# Patient Record
Sex: Male | Born: 1965
Health system: Southern US, Community
[De-identification: ages and names within clinical notes are randomized; demographics above are authoritative.]

## PROBLEM LIST (undated history)

## (undated) DIAGNOSIS — Z9889 Other specified postprocedural states: Secondary | ICD-10-CM

## (undated) DIAGNOSIS — J45909 Unspecified asthma, uncomplicated: Secondary | ICD-10-CM

## (undated) DIAGNOSIS — G5620 Lesion of ulnar nerve, unspecified upper limb: Secondary | ICD-10-CM

## (undated) DIAGNOSIS — K649 Unspecified hemorrhoids: Secondary | ICD-10-CM

## (undated) DIAGNOSIS — K589 Irritable bowel syndrome without diarrhea: Secondary | ICD-10-CM

## (undated) DIAGNOSIS — Z8719 Personal history of other diseases of the digestive system: Secondary | ICD-10-CM

## (undated) DIAGNOSIS — K5790 Diverticulosis of intestine, part unspecified, without perforation or abscess without bleeding: Secondary | ICD-10-CM

## (undated) HISTORY — DX: Other specified postprocedural states: Z98.890

## (undated) HISTORY — DX: Diverticulosis of intestine, part unspecified, without perforation or abscess without bleeding: K57.90

## (undated) HISTORY — DX: Lesion of ulnar nerve, unspecified upper limb: G56.20

## (undated) HISTORY — PX: HERNIA REPAIR: SHX51

## (undated) HISTORY — DX: Irritable bowel syndrome, unspecified: K58.9

## (undated) HISTORY — DX: Unspecified asthma, uncomplicated: J45.909

## (undated) HISTORY — DX: Personal history of other diseases of the digestive system: Z87.19

## (undated) HISTORY — DX: Unspecified hemorrhoids: K64.9

## (undated) HISTORY — PX: COLONOSCOPY: SHX174

---

## 2000-05-20 ENCOUNTER — Encounter: Payer: Self-pay | Admitting: Family Medicine

## 2000-05-20 ENCOUNTER — Ambulatory Visit (HOSPITAL_COMMUNITY): Admission: RE | Admit: 2000-05-20 | Discharge: 2000-05-20 | Payer: Self-pay | Admitting: Family Medicine

## 2001-08-11 ENCOUNTER — Encounter: Payer: Self-pay | Admitting: Family Medicine

## 2001-08-11 ENCOUNTER — Ambulatory Visit (HOSPITAL_COMMUNITY): Admission: RE | Admit: 2001-08-11 | Discharge: 2001-08-11 | Payer: Self-pay | Admitting: Family Medicine

## 2001-09-14 ENCOUNTER — Encounter: Payer: Self-pay | Admitting: Otolaryngology

## 2001-09-14 ENCOUNTER — Ambulatory Visit (HOSPITAL_COMMUNITY): Admission: RE | Admit: 2001-09-14 | Discharge: 2001-09-14 | Payer: Self-pay | Admitting: Otolaryngology

## 2001-09-26 ENCOUNTER — Encounter: Payer: Self-pay | Admitting: Otolaryngology

## 2001-09-26 ENCOUNTER — Encounter (INDEPENDENT_AMBULATORY_CARE_PROVIDER_SITE_OTHER): Payer: Self-pay | Admitting: *Deleted

## 2001-09-26 ENCOUNTER — Ambulatory Visit (HOSPITAL_COMMUNITY): Admission: RE | Admit: 2001-09-26 | Discharge: 2001-09-26 | Payer: Self-pay | Admitting: Otolaryngology

## 2003-03-21 ENCOUNTER — Encounter: Admission: RE | Admit: 2003-03-21 | Discharge: 2003-03-21 | Payer: Self-pay | Admitting: Gastroenterology

## 2003-09-20 ENCOUNTER — Encounter: Admission: RE | Admit: 2003-09-20 | Discharge: 2003-09-20 | Payer: Self-pay | Admitting: Gastroenterology

## 2005-06-03 ENCOUNTER — Ambulatory Visit (HOSPITAL_COMMUNITY): Admission: RE | Admit: 2005-06-03 | Discharge: 2005-06-03 | Payer: Self-pay | Admitting: Cardiology

## 2005-06-03 ENCOUNTER — Ambulatory Visit: Payer: Self-pay | Admitting: Cardiovascular Disease

## 2009-03-25 ENCOUNTER — Encounter: Admission: RE | Admit: 2009-03-25 | Discharge: 2009-03-25 | Payer: Self-pay | Admitting: Gastroenterology

## 2009-10-17 ENCOUNTER — Ambulatory Visit (HOSPITAL_COMMUNITY): Admission: RE | Admit: 2009-10-17 | Discharge: 2009-10-17 | Payer: Self-pay | Admitting: Chiropractic Medicine

## 2010-10-28 ENCOUNTER — Ambulatory Visit: Payer: Managed Care, Other (non HMO) | Attending: Orthopaedic Surgery

## 2010-10-28 DIAGNOSIS — IMO0001 Reserved for inherently not codable concepts without codable children: Secondary | ICD-10-CM | POA: Insufficient documentation

## 2010-10-28 DIAGNOSIS — M6281 Muscle weakness (generalized): Secondary | ICD-10-CM | POA: Insufficient documentation

## 2010-10-28 DIAGNOSIS — M25569 Pain in unspecified knee: Secondary | ICD-10-CM | POA: Insufficient documentation

## 2010-10-28 DIAGNOSIS — R262 Difficulty in walking, not elsewhere classified: Secondary | ICD-10-CM | POA: Insufficient documentation

## 2010-10-30 ENCOUNTER — Ambulatory Visit: Payer: Managed Care, Other (non HMO) | Admitting: Physical Therapy

## 2010-11-04 ENCOUNTER — Ambulatory Visit: Payer: Managed Care, Other (non HMO) | Admitting: Physical Therapy

## 2010-11-06 ENCOUNTER — Ambulatory Visit: Payer: Managed Care, Other (non HMO) | Attending: Orthopaedic Surgery | Admitting: Physical Therapy

## 2010-11-06 DIAGNOSIS — M25569 Pain in unspecified knee: Secondary | ICD-10-CM | POA: Insufficient documentation

## 2010-11-06 DIAGNOSIS — M6281 Muscle weakness (generalized): Secondary | ICD-10-CM | POA: Insufficient documentation

## 2010-11-06 DIAGNOSIS — R262 Difficulty in walking, not elsewhere classified: Secondary | ICD-10-CM | POA: Insufficient documentation

## 2010-11-06 DIAGNOSIS — IMO0001 Reserved for inherently not codable concepts without codable children: Secondary | ICD-10-CM | POA: Insufficient documentation

## 2010-11-09 ENCOUNTER — Ambulatory Visit: Payer: Managed Care, Other (non HMO) | Admitting: Physical Therapy

## 2010-11-11 ENCOUNTER — Ambulatory Visit: Payer: Managed Care, Other (non HMO) | Admitting: Physical Therapy

## 2010-11-16 ENCOUNTER — Encounter: Payer: Managed Care, Other (non HMO) | Admitting: Physical Therapy

## 2010-11-18 ENCOUNTER — Ambulatory Visit: Payer: Managed Care, Other (non HMO)

## 2010-11-23 ENCOUNTER — Ambulatory Visit: Payer: Managed Care, Other (non HMO)

## 2010-11-30 ENCOUNTER — Ambulatory Visit: Payer: Managed Care, Other (non HMO) | Admitting: Physical Therapy

## 2010-12-02 ENCOUNTER — Ambulatory Visit: Payer: Managed Care, Other (non HMO) | Admitting: Physical Therapy

## 2011-09-17 ENCOUNTER — Other Ambulatory Visit: Payer: Self-pay | Admitting: Family Medicine

## 2011-09-17 DIAGNOSIS — R079 Chest pain, unspecified: Secondary | ICD-10-CM

## 2011-09-20 ENCOUNTER — Ambulatory Visit
Admission: RE | Admit: 2011-09-20 | Discharge: 2011-09-20 | Disposition: A | Discharge status: Home / Self Care | Payer: Managed Care, Other (non HMO) | Source: Ambulatory Visit | Attending: Family Medicine | Admitting: Family Medicine

## 2011-09-20 DIAGNOSIS — R079 Chest pain, unspecified: Secondary | ICD-10-CM

## 2011-09-20 MED ORDER — IOHEXOL 300 MG/ML  SOLN
75.0000 mL | Freq: Once | INTRAMUSCULAR | Status: AC | PRN
  Administered 2011-09-20: 75 mL via INTRAVENOUS

## 2011-12-22 ENCOUNTER — Ambulatory Visit: Payer: Managed Care, Other (non HMO) | Attending: Orthopaedic Surgery

## 2011-12-22 DIAGNOSIS — M545 Low back pain, unspecified: Secondary | ICD-10-CM | POA: Insufficient documentation

## 2011-12-22 DIAGNOSIS — IMO0001 Reserved for inherently not codable concepts without codable children: Secondary | ICD-10-CM | POA: Insufficient documentation

## 2011-12-22 DIAGNOSIS — M25559 Pain in unspecified hip: Secondary | ICD-10-CM | POA: Insufficient documentation

## 2011-12-22 DIAGNOSIS — R5381 Other malaise: Secondary | ICD-10-CM | POA: Insufficient documentation

## 2011-12-23 ENCOUNTER — Ambulatory Visit: Payer: Managed Care, Other (non HMO)

## 2011-12-23 ENCOUNTER — Other Ambulatory Visit: Payer: Self-pay | Admitting: Family Medicine

## 2011-12-23 DIAGNOSIS — R918 Other nonspecific abnormal finding of lung field: Secondary | ICD-10-CM

## 2011-12-27 ENCOUNTER — Ambulatory Visit: Payer: Managed Care, Other (non HMO) | Admitting: Physical Therapy

## 2011-12-31 ENCOUNTER — Ambulatory Visit: Payer: Managed Care, Other (non HMO) | Admitting: Physical Therapy

## 2012-01-03 ENCOUNTER — Ambulatory Visit: Payer: Managed Care, Other (non HMO) | Admitting: Physical Therapy

## 2012-01-06 ENCOUNTER — Ambulatory Visit: Payer: Managed Care, Other (non HMO) | Attending: Orthopaedic Surgery | Admitting: Physical Therapy

## 2012-01-06 DIAGNOSIS — M545 Low back pain, unspecified: Secondary | ICD-10-CM | POA: Insufficient documentation

## 2012-01-06 DIAGNOSIS — M25559 Pain in unspecified hip: Secondary | ICD-10-CM | POA: Insufficient documentation

## 2012-01-06 DIAGNOSIS — IMO0001 Reserved for inherently not codable concepts without codable children: Secondary | ICD-10-CM | POA: Insufficient documentation

## 2012-01-06 DIAGNOSIS — R5381 Other malaise: Secondary | ICD-10-CM | POA: Insufficient documentation

## 2012-01-12 ENCOUNTER — Ambulatory Visit: Payer: Managed Care, Other (non HMO) | Admitting: Physical Therapy

## 2012-01-13 ENCOUNTER — Ambulatory Visit: Payer: Managed Care, Other (non HMO) | Admitting: Physical Therapy

## 2012-01-31 ENCOUNTER — Ambulatory Visit
Admission: RE | Admit: 2012-01-31 | Discharge: 2012-01-31 | Disposition: A | Discharge status: Home / Self Care | Payer: Managed Care, Other (non HMO) | Source: Ambulatory Visit | Attending: Family Medicine | Admitting: Family Medicine

## 2012-01-31 DIAGNOSIS — R918 Other nonspecific abnormal finding of lung field: Secondary | ICD-10-CM

## 2012-02-15 ENCOUNTER — Ambulatory Visit: Payer: Managed Care, Other (non HMO) | Attending: Orthopaedic Surgery | Admitting: Physical Therapy

## 2012-02-15 DIAGNOSIS — M25569 Pain in unspecified knee: Secondary | ICD-10-CM | POA: Insufficient documentation

## 2012-02-15 DIAGNOSIS — IMO0001 Reserved for inherently not codable concepts without codable children: Secondary | ICD-10-CM | POA: Insufficient documentation

## 2012-02-23 ENCOUNTER — Ambulatory Visit: Payer: Managed Care, Other (non HMO) | Admitting: Physical Therapy

## 2012-02-24 ENCOUNTER — Ambulatory Visit: Payer: Managed Care, Other (non HMO) | Admitting: Physical Therapy

## 2012-03-01 ENCOUNTER — Ambulatory Visit: Payer: Managed Care, Other (non HMO) | Admitting: Physical Therapy

## 2012-03-03 ENCOUNTER — Encounter: Payer: Managed Care, Other (non HMO) | Admitting: Physical Therapy

## 2012-03-08 ENCOUNTER — Ambulatory Visit: Payer: Managed Care, Other (non HMO) | Attending: Orthopaedic Surgery | Admitting: Physical Therapy

## 2012-03-08 DIAGNOSIS — M25569 Pain in unspecified knee: Secondary | ICD-10-CM | POA: Insufficient documentation

## 2012-03-08 DIAGNOSIS — IMO0001 Reserved for inherently not codable concepts without codable children: Secondary | ICD-10-CM | POA: Insufficient documentation

## 2012-03-10 ENCOUNTER — Ambulatory Visit: Payer: Managed Care, Other (non HMO) | Admitting: Physical Therapy

## 2012-03-15 ENCOUNTER — Ambulatory Visit: Payer: Managed Care, Other (non HMO) | Admitting: Physical Therapy

## 2012-08-22 ENCOUNTER — Other Ambulatory Visit: Payer: Self-pay | Admitting: Family Medicine

## 2012-08-22 DIAGNOSIS — R911 Solitary pulmonary nodule: Secondary | ICD-10-CM

## 2012-10-03 ENCOUNTER — Ambulatory Visit
Admission: RE | Admit: 2012-10-03 | Discharge: 2012-10-03 | Disposition: A | Discharge status: Home / Self Care | Payer: Managed Care, Other (non HMO) | Source: Ambulatory Visit | Attending: Family Medicine | Admitting: Family Medicine

## 2012-10-03 DIAGNOSIS — R911 Solitary pulmonary nodule: Secondary | ICD-10-CM

## 2013-08-25 ENCOUNTER — Encounter: Payer: Self-pay | Admitting: *Deleted

## 2013-09-19 ENCOUNTER — Other Ambulatory Visit: Payer: Self-pay | Admitting: Family Medicine

## 2013-09-19 DIAGNOSIS — R911 Solitary pulmonary nodule: Secondary | ICD-10-CM

## 2013-10-08 ENCOUNTER — Ambulatory Visit
Admission: RE | Admit: 2013-10-08 | Discharge: 2013-10-08 | Disposition: A | Discharge status: Home / Self Care | Payer: Managed Care, Other (non HMO) | Source: Ambulatory Visit | Attending: Family Medicine | Admitting: Family Medicine

## 2013-10-08 DIAGNOSIS — R911 Solitary pulmonary nodule: Secondary | ICD-10-CM

## 2013-10-11 ENCOUNTER — Other Ambulatory Visit: Payer: Self-pay | Admitting: Family Medicine

## 2013-10-11 DIAGNOSIS — E041 Nontoxic single thyroid nodule: Secondary | ICD-10-CM

## 2013-10-26 ENCOUNTER — Ambulatory Visit
Admission: RE | Admit: 2013-10-26 | Discharge: 2013-10-26 | Disposition: A | Discharge status: Home / Self Care | Payer: Managed Care, Other (non HMO) | Source: Ambulatory Visit | Attending: Family Medicine | Admitting: Family Medicine

## 2013-10-26 DIAGNOSIS — E041 Nontoxic single thyroid nodule: Secondary | ICD-10-CM

## 2013-10-31 ENCOUNTER — Other Ambulatory Visit: Payer: Self-pay | Admitting: Family Medicine

## 2013-10-31 DIAGNOSIS — E041 Nontoxic single thyroid nodule: Secondary | ICD-10-CM

## 2013-11-06 ENCOUNTER — Inpatient Hospital Stay: Admission: RE | Admit: 2013-11-06 | Payer: Managed Care, Other (non HMO) | Source: Ambulatory Visit

## 2013-11-14 ENCOUNTER — Other Ambulatory Visit (HOSPITAL_COMMUNITY)
Admission: RE | Admit: 2013-11-14 | Discharge: 2013-11-14 | Disposition: A | Discharge status: Home / Self Care | Payer: Managed Care, Other (non HMO) | Source: Ambulatory Visit | Attending: Interventional Radiology | Admitting: Interventional Radiology

## 2013-11-14 ENCOUNTER — Ambulatory Visit
Admission: RE | Admit: 2013-11-14 | Discharge: 2013-11-14 | Disposition: A | Discharge status: Home / Self Care | Payer: Managed Care, Other (non HMO) | Source: Ambulatory Visit | Attending: Family Medicine | Admitting: Family Medicine

## 2013-11-14 DIAGNOSIS — E041 Nontoxic single thyroid nodule: Secondary | ICD-10-CM | POA: Diagnosis not present

## 2013-12-06 ENCOUNTER — Other Ambulatory Visit: Payer: Self-pay | Admitting: Family Medicine

## 2013-12-06 DIAGNOSIS — M25512 Pain in left shoulder: Secondary | ICD-10-CM

## 2013-12-18 ENCOUNTER — Ambulatory Visit
Admission: RE | Admit: 2013-12-18 | Discharge: 2013-12-18 | Disposition: A | Discharge status: Home / Self Care | Payer: Managed Care, Other (non HMO) | Source: Ambulatory Visit | Attending: Family Medicine | Admitting: Family Medicine

## 2013-12-18 ENCOUNTER — Other Ambulatory Visit: Payer: Self-pay | Admitting: Family Medicine

## 2013-12-18 DIAGNOSIS — M25512 Pain in left shoulder: Secondary | ICD-10-CM

## 2013-12-18 MED ORDER — IOHEXOL 180 MG/ML  SOLN
15.0000 mL | Freq: Once | INTRAMUSCULAR | Status: AC | PRN
  Administered 2013-12-18: 15 mL via INTRA_ARTICULAR

## 2015-04-24 DIAGNOSIS — E782 Mixed hyperlipidemia: Secondary | ICD-10-CM | POA: Diagnosis not present

## 2015-06-22 IMAGING — US US THYROID BIOPSY
1 series · 6 of 6 positions shown · non-contrast
Comparison: Thyroid ultrasound on 10/26/2013

CLINICAL DATA: Enlarging dominant right thyroid nodule with
previous negative biopsy in 7555.

EXAM:
ULTRASOUND GUIDED NEEDLE ASPIRATE BIOPSY OF THE THYROID GLAND

[Series 1: us thyroid biopsy · 0.07mm/px · 6 acquisitions, 6 frames shown]
[im 1/6]
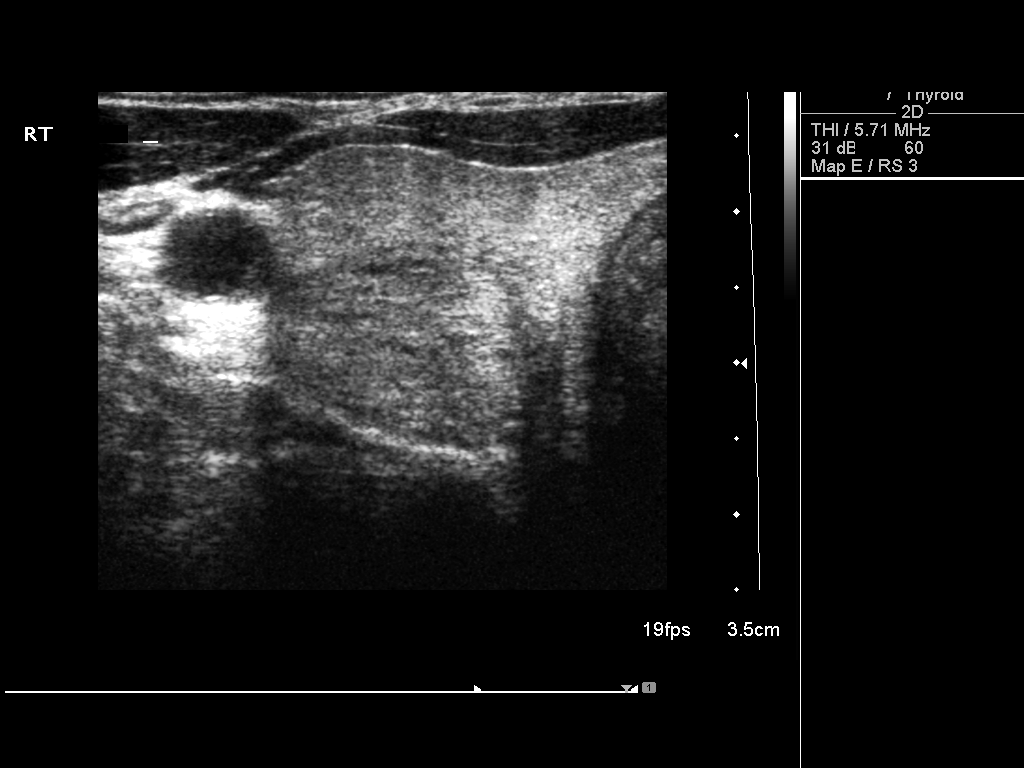
[im 2/6]
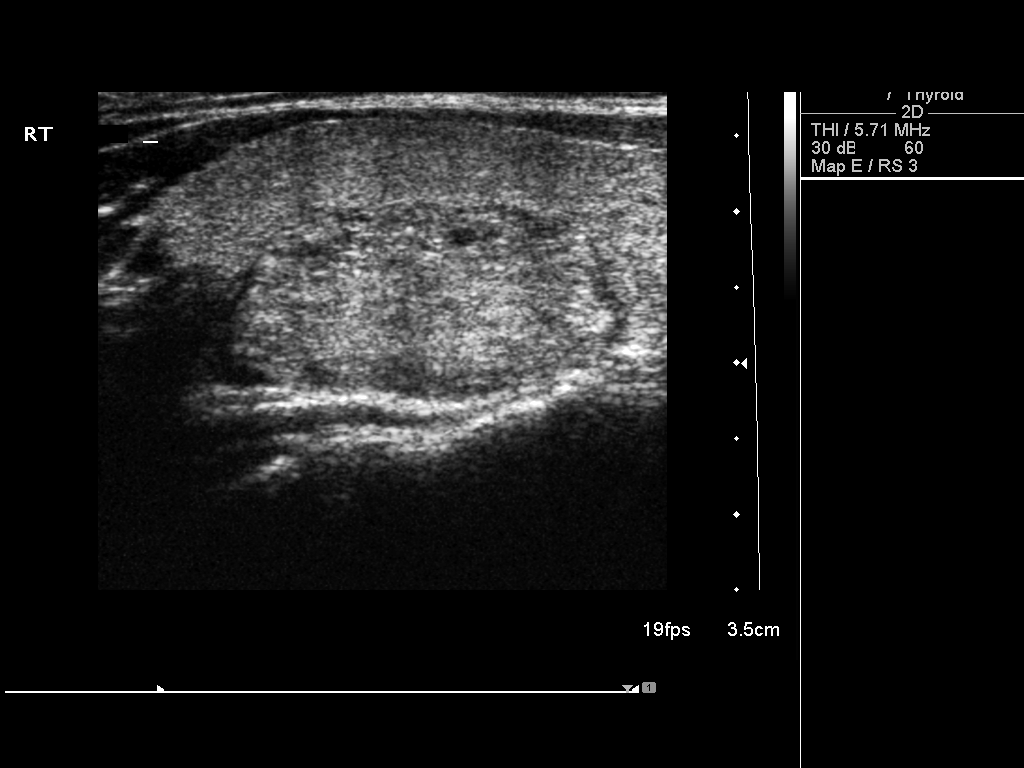
[im 3/6]
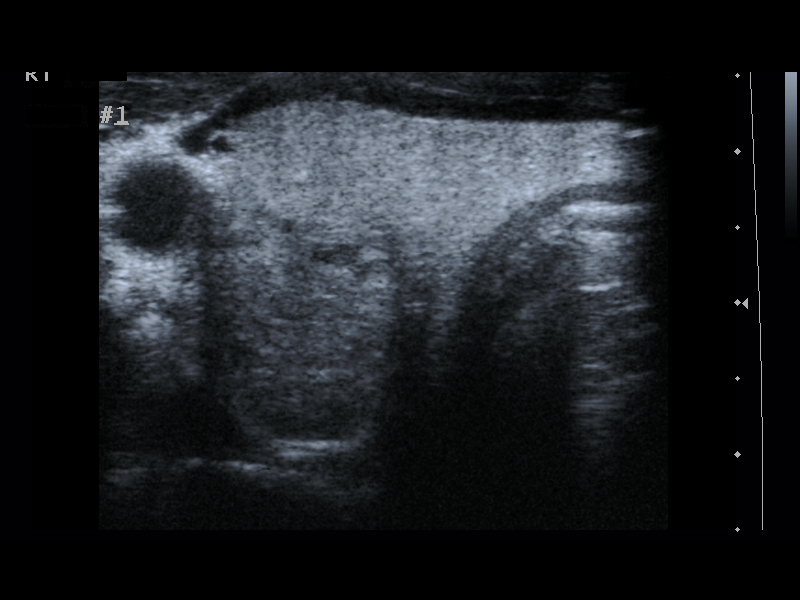
[im 4/6]
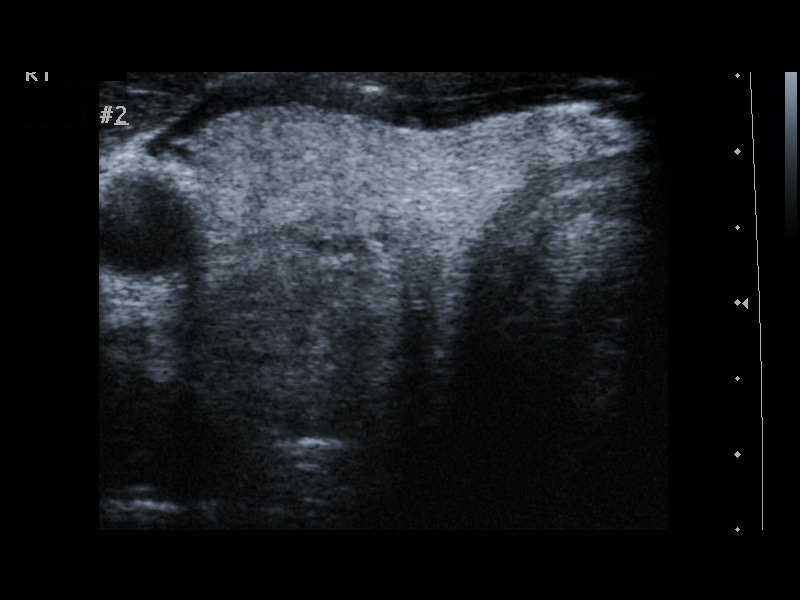
[im 5/6]
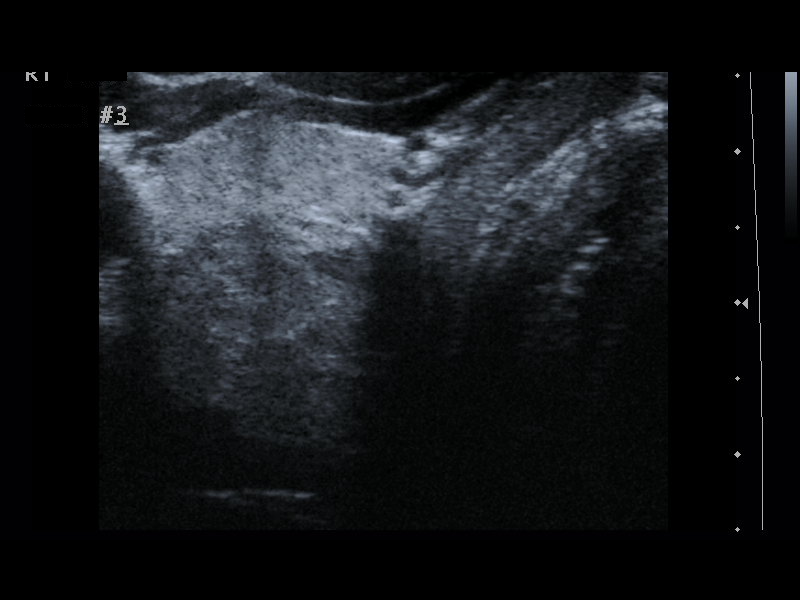
[im 6/6]
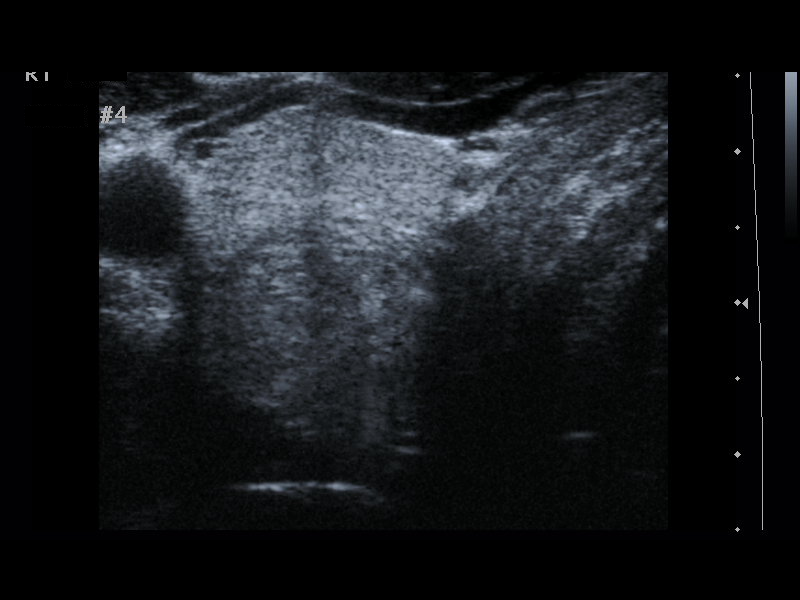

[6 of 6 positions shown; findings below may reference images not displayed]

PROCEDURE:
Thyroid biopsy was thoroughly discussed with the patient and
questions were answered. The benefits, risks, alternatives, and
complications were also discussed. The patient understands and
wishes to proceed with the procedure. Written consent was obtained.

Ultrasound was performed to localize and mark an adequate site for
the biopsy. The patient was then prepped and draped in a normal
sterile fashion. Local anesthesia was provided with 1% lidocaine.
Using direct ultrasound guidance, 4 passes were made using 25 gauge
needles into the nodule within the right lobe of the thyroid.
Ultrasound was used to confirm needle placements on all occasions.
Specimens were sent to Pathology for analysis.

COMPLICATIONS:
None
FINDINGS: Needle aspirate samples were obtained in different portions of the
dominant right thyroid nodule.
IMPRESSION: Ultrasound guided needle aspirate biopsy performed of the dominant
right thyroid nodule.

## 2015-07-16 DIAGNOSIS — E782 Mixed hyperlipidemia: Secondary | ICD-10-CM | POA: Diagnosis not present

## 2015-09-30 ENCOUNTER — Encounter (INDEPENDENT_AMBULATORY_CARE_PROVIDER_SITE_OTHER): Payer: Self-pay

## 2015-10-02 DIAGNOSIS — M5441 Lumbago with sciatica, right side: Secondary | ICD-10-CM | POA: Diagnosis not present

## 2015-10-02 DIAGNOSIS — M545 Low back pain: Secondary | ICD-10-CM | POA: Diagnosis not present

## 2015-10-08 DIAGNOSIS — M542 Cervicalgia: Secondary | ICD-10-CM | POA: Diagnosis not present

## 2015-10-08 DIAGNOSIS — M9903 Segmental and somatic dysfunction of lumbar region: Secondary | ICD-10-CM | POA: Diagnosis not present

## 2015-10-08 DIAGNOSIS — M62830 Muscle spasm of back: Secondary | ICD-10-CM | POA: Diagnosis not present

## 2015-10-08 DIAGNOSIS — M9901 Segmental and somatic dysfunction of cervical region: Secondary | ICD-10-CM | POA: Diagnosis not present

## 2015-10-09 DIAGNOSIS — M9903 Segmental and somatic dysfunction of lumbar region: Secondary | ICD-10-CM | POA: Diagnosis not present

## 2015-10-09 DIAGNOSIS — M9901 Segmental and somatic dysfunction of cervical region: Secondary | ICD-10-CM | POA: Diagnosis not present

## 2015-10-09 DIAGNOSIS — M542 Cervicalgia: Secondary | ICD-10-CM | POA: Diagnosis not present

## 2015-10-09 DIAGNOSIS — M62830 Muscle spasm of back: Secondary | ICD-10-CM | POA: Diagnosis not present

## 2015-10-14 DIAGNOSIS — M62830 Muscle spasm of back: Secondary | ICD-10-CM | POA: Diagnosis not present

## 2015-10-14 DIAGNOSIS — M542 Cervicalgia: Secondary | ICD-10-CM | POA: Diagnosis not present

## 2015-10-14 DIAGNOSIS — M545 Low back pain: Secondary | ICD-10-CM | POA: Diagnosis not present

## 2015-10-14 DIAGNOSIS — M9903 Segmental and somatic dysfunction of lumbar region: Secondary | ICD-10-CM | POA: Diagnosis not present

## 2015-10-14 DIAGNOSIS — M9901 Segmental and somatic dysfunction of cervical region: Secondary | ICD-10-CM | POA: Diagnosis not present

## 2015-10-15 DIAGNOSIS — M542 Cervicalgia: Secondary | ICD-10-CM | POA: Diagnosis not present

## 2015-10-15 DIAGNOSIS — M9903 Segmental and somatic dysfunction of lumbar region: Secondary | ICD-10-CM | POA: Diagnosis not present

## 2015-10-15 DIAGNOSIS — M62830 Muscle spasm of back: Secondary | ICD-10-CM | POA: Diagnosis not present

## 2015-10-15 DIAGNOSIS — M9901 Segmental and somatic dysfunction of cervical region: Secondary | ICD-10-CM | POA: Diagnosis not present

## 2015-10-16 DIAGNOSIS — M9903 Segmental and somatic dysfunction of lumbar region: Secondary | ICD-10-CM | POA: Diagnosis not present

## 2015-10-16 DIAGNOSIS — M9901 Segmental and somatic dysfunction of cervical region: Secondary | ICD-10-CM | POA: Diagnosis not present

## 2015-10-16 DIAGNOSIS — M62830 Muscle spasm of back: Secondary | ICD-10-CM | POA: Diagnosis not present

## 2015-10-16 DIAGNOSIS — M542 Cervicalgia: Secondary | ICD-10-CM | POA: Diagnosis not present

## 2015-10-21 DIAGNOSIS — J9801 Acute bronchospasm: Secondary | ICD-10-CM | POA: Diagnosis not present

## 2015-10-21 DIAGNOSIS — R39198 Other difficulties with micturition: Secondary | ICD-10-CM | POA: Diagnosis not present

## 2015-10-21 DIAGNOSIS — M9903 Segmental and somatic dysfunction of lumbar region: Secondary | ICD-10-CM | POA: Diagnosis not present

## 2015-10-21 DIAGNOSIS — G43109 Migraine with aura, not intractable, without status migrainosus: Secondary | ICD-10-CM | POA: Diagnosis not present

## 2015-10-21 DIAGNOSIS — Z0000 Encounter for general adult medical examination without abnormal findings: Secondary | ICD-10-CM | POA: Diagnosis not present

## 2015-10-21 DIAGNOSIS — E782 Mixed hyperlipidemia: Secondary | ICD-10-CM | POA: Diagnosis not present

## 2015-10-21 DIAGNOSIS — M9901 Segmental and somatic dysfunction of cervical region: Secondary | ICD-10-CM | POA: Diagnosis not present

## 2015-10-21 DIAGNOSIS — M62830 Muscle spasm of back: Secondary | ICD-10-CM | POA: Diagnosis not present

## 2015-10-21 DIAGNOSIS — M542 Cervicalgia: Secondary | ICD-10-CM | POA: Diagnosis not present

## 2015-10-22 DIAGNOSIS — M9901 Segmental and somatic dysfunction of cervical region: Secondary | ICD-10-CM | POA: Diagnosis not present

## 2015-10-22 DIAGNOSIS — M9903 Segmental and somatic dysfunction of lumbar region: Secondary | ICD-10-CM | POA: Diagnosis not present

## 2015-10-22 DIAGNOSIS — M542 Cervicalgia: Secondary | ICD-10-CM | POA: Diagnosis not present

## 2015-10-22 DIAGNOSIS — M62830 Muscle spasm of back: Secondary | ICD-10-CM | POA: Diagnosis not present

## 2015-10-23 DIAGNOSIS — M9903 Segmental and somatic dysfunction of lumbar region: Secondary | ICD-10-CM | POA: Diagnosis not present

## 2015-10-23 DIAGNOSIS — M542 Cervicalgia: Secondary | ICD-10-CM | POA: Diagnosis not present

## 2015-10-23 DIAGNOSIS — M62830 Muscle spasm of back: Secondary | ICD-10-CM | POA: Diagnosis not present

## 2015-10-23 DIAGNOSIS — M9901 Segmental and somatic dysfunction of cervical region: Secondary | ICD-10-CM | POA: Diagnosis not present

## 2015-10-28 DIAGNOSIS — M62830 Muscle spasm of back: Secondary | ICD-10-CM | POA: Diagnosis not present

## 2015-10-28 DIAGNOSIS — M9901 Segmental and somatic dysfunction of cervical region: Secondary | ICD-10-CM | POA: Diagnosis not present

## 2015-10-28 DIAGNOSIS — M9903 Segmental and somatic dysfunction of lumbar region: Secondary | ICD-10-CM | POA: Diagnosis not present

## 2015-10-28 DIAGNOSIS — M542 Cervicalgia: Secondary | ICD-10-CM | POA: Diagnosis not present

## 2015-10-29 DIAGNOSIS — M62830 Muscle spasm of back: Secondary | ICD-10-CM | POA: Diagnosis not present

## 2015-10-29 DIAGNOSIS — M9901 Segmental and somatic dysfunction of cervical region: Secondary | ICD-10-CM | POA: Diagnosis not present

## 2015-10-29 DIAGNOSIS — M9903 Segmental and somatic dysfunction of lumbar region: Secondary | ICD-10-CM | POA: Diagnosis not present

## 2015-10-29 DIAGNOSIS — M542 Cervicalgia: Secondary | ICD-10-CM | POA: Diagnosis not present

## 2015-10-30 DIAGNOSIS — M542 Cervicalgia: Secondary | ICD-10-CM | POA: Diagnosis not present

## 2015-10-30 DIAGNOSIS — M9903 Segmental and somatic dysfunction of lumbar region: Secondary | ICD-10-CM | POA: Diagnosis not present

## 2015-10-30 DIAGNOSIS — M62830 Muscle spasm of back: Secondary | ICD-10-CM | POA: Diagnosis not present

## 2015-10-30 DIAGNOSIS — M9901 Segmental and somatic dysfunction of cervical region: Secondary | ICD-10-CM | POA: Diagnosis not present

## 2015-11-04 DIAGNOSIS — M9901 Segmental and somatic dysfunction of cervical region: Secondary | ICD-10-CM | POA: Diagnosis not present

## 2015-11-04 DIAGNOSIS — M542 Cervicalgia: Secondary | ICD-10-CM | POA: Diagnosis not present

## 2015-11-04 DIAGNOSIS — M9903 Segmental and somatic dysfunction of lumbar region: Secondary | ICD-10-CM | POA: Diagnosis not present

## 2015-11-04 DIAGNOSIS — M62830 Muscle spasm of back: Secondary | ICD-10-CM | POA: Diagnosis not present

## 2015-11-05 DIAGNOSIS — M62830 Muscle spasm of back: Secondary | ICD-10-CM | POA: Diagnosis not present

## 2015-11-05 DIAGNOSIS — M9901 Segmental and somatic dysfunction of cervical region: Secondary | ICD-10-CM | POA: Diagnosis not present

## 2015-11-05 DIAGNOSIS — M9903 Segmental and somatic dysfunction of lumbar region: Secondary | ICD-10-CM | POA: Diagnosis not present

## 2015-11-05 DIAGNOSIS — M542 Cervicalgia: Secondary | ICD-10-CM | POA: Diagnosis not present

## 2015-11-11 DIAGNOSIS — M5441 Lumbago with sciatica, right side: Secondary | ICD-10-CM | POA: Diagnosis not present

## 2015-11-11 DIAGNOSIS — M542 Cervicalgia: Secondary | ICD-10-CM | POA: Diagnosis not present

## 2015-11-11 DIAGNOSIS — M9903 Segmental and somatic dysfunction of lumbar region: Secondary | ICD-10-CM | POA: Diagnosis not present

## 2015-11-11 DIAGNOSIS — M62830 Muscle spasm of back: Secondary | ICD-10-CM | POA: Diagnosis not present

## 2015-11-11 DIAGNOSIS — M9901 Segmental and somatic dysfunction of cervical region: Secondary | ICD-10-CM | POA: Diagnosis not present

## 2015-11-12 DIAGNOSIS — M542 Cervicalgia: Secondary | ICD-10-CM | POA: Diagnosis not present

## 2015-11-12 DIAGNOSIS — M9901 Segmental and somatic dysfunction of cervical region: Secondary | ICD-10-CM | POA: Diagnosis not present

## 2015-11-12 DIAGNOSIS — M9903 Segmental and somatic dysfunction of lumbar region: Secondary | ICD-10-CM | POA: Diagnosis not present

## 2015-11-12 DIAGNOSIS — M62830 Muscle spasm of back: Secondary | ICD-10-CM | POA: Diagnosis not present

## 2015-11-18 DIAGNOSIS — M542 Cervicalgia: Secondary | ICD-10-CM | POA: Diagnosis not present

## 2015-11-18 DIAGNOSIS — M62830 Muscle spasm of back: Secondary | ICD-10-CM | POA: Diagnosis not present

## 2015-11-18 DIAGNOSIS — M9901 Segmental and somatic dysfunction of cervical region: Secondary | ICD-10-CM | POA: Diagnosis not present

## 2015-11-18 DIAGNOSIS — M9903 Segmental and somatic dysfunction of lumbar region: Secondary | ICD-10-CM | POA: Diagnosis not present

## 2015-11-20 DIAGNOSIS — M9901 Segmental and somatic dysfunction of cervical region: Secondary | ICD-10-CM | POA: Diagnosis not present

## 2015-11-20 DIAGNOSIS — M9903 Segmental and somatic dysfunction of lumbar region: Secondary | ICD-10-CM | POA: Diagnosis not present

## 2015-11-20 DIAGNOSIS — M62830 Muscle spasm of back: Secondary | ICD-10-CM | POA: Diagnosis not present

## 2015-11-20 DIAGNOSIS — M542 Cervicalgia: Secondary | ICD-10-CM | POA: Diagnosis not present

## 2015-11-25 DIAGNOSIS — M9903 Segmental and somatic dysfunction of lumbar region: Secondary | ICD-10-CM | POA: Diagnosis not present

## 2015-11-25 DIAGNOSIS — M62830 Muscle spasm of back: Secondary | ICD-10-CM | POA: Diagnosis not present

## 2015-11-25 DIAGNOSIS — M9901 Segmental and somatic dysfunction of cervical region: Secondary | ICD-10-CM | POA: Diagnosis not present

## 2015-11-25 DIAGNOSIS — M542 Cervicalgia: Secondary | ICD-10-CM | POA: Diagnosis not present

## 2015-12-02 DIAGNOSIS — M9901 Segmental and somatic dysfunction of cervical region: Secondary | ICD-10-CM | POA: Diagnosis not present

## 2015-12-02 DIAGNOSIS — M542 Cervicalgia: Secondary | ICD-10-CM | POA: Diagnosis not present

## 2015-12-02 DIAGNOSIS — M9903 Segmental and somatic dysfunction of lumbar region: Secondary | ICD-10-CM | POA: Diagnosis not present

## 2015-12-02 DIAGNOSIS — M62830 Muscle spasm of back: Secondary | ICD-10-CM | POA: Diagnosis not present

## 2015-12-04 DIAGNOSIS — M542 Cervicalgia: Secondary | ICD-10-CM | POA: Diagnosis not present

## 2015-12-04 DIAGNOSIS — M9901 Segmental and somatic dysfunction of cervical region: Secondary | ICD-10-CM | POA: Diagnosis not present

## 2015-12-04 DIAGNOSIS — M9903 Segmental and somatic dysfunction of lumbar region: Secondary | ICD-10-CM | POA: Diagnosis not present

## 2015-12-04 DIAGNOSIS — M62830 Muscle spasm of back: Secondary | ICD-10-CM | POA: Diagnosis not present

## 2015-12-10 DIAGNOSIS — R3912 Poor urinary stream: Secondary | ICD-10-CM | POA: Diagnosis not present

## 2015-12-10 DIAGNOSIS — R338 Other retention of urine: Secondary | ICD-10-CM | POA: Diagnosis not present

## 2015-12-11 DIAGNOSIS — M542 Cervicalgia: Secondary | ICD-10-CM | POA: Diagnosis not present

## 2015-12-11 DIAGNOSIS — M9901 Segmental and somatic dysfunction of cervical region: Secondary | ICD-10-CM | POA: Diagnosis not present

## 2015-12-11 DIAGNOSIS — M9903 Segmental and somatic dysfunction of lumbar region: Secondary | ICD-10-CM | POA: Diagnosis not present

## 2015-12-11 DIAGNOSIS — M62830 Muscle spasm of back: Secondary | ICD-10-CM | POA: Diagnosis not present

## 2016-02-27 DIAGNOSIS — R338 Other retention of urine: Secondary | ICD-10-CM | POA: Diagnosis not present

## 2016-08-26 DIAGNOSIS — K130 Diseases of lips: Secondary | ICD-10-CM | POA: Diagnosis not present

## 2016-09-28 DIAGNOSIS — L237 Allergic contact dermatitis due to plants, except food: Secondary | ICD-10-CM | POA: Diagnosis not present

## 2016-11-29 DIAGNOSIS — M542 Cervicalgia: Secondary | ICD-10-CM | POA: Diagnosis not present

## 2016-11-29 DIAGNOSIS — Z0000 Encounter for general adult medical examination without abnormal findings: Secondary | ICD-10-CM | POA: Diagnosis not present

## 2016-11-29 DIAGNOSIS — E782 Mixed hyperlipidemia: Secondary | ICD-10-CM | POA: Diagnosis not present

## 2016-11-29 DIAGNOSIS — J9801 Acute bronchospasm: Secondary | ICD-10-CM | POA: Diagnosis not present

## 2016-11-29 DIAGNOSIS — Z91038 Other insect allergy status: Secondary | ICD-10-CM | POA: Diagnosis not present

## 2016-11-30 ENCOUNTER — Other Ambulatory Visit: Payer: Self-pay | Admitting: Physician Assistant

## 2016-11-30 DIAGNOSIS — R55 Syncope and collapse: Secondary | ICD-10-CM

## 2016-12-01 ENCOUNTER — Other Ambulatory Visit: Payer: Self-pay | Admitting: Physician Assistant

## 2016-12-01 DIAGNOSIS — M542 Cervicalgia: Secondary | ICD-10-CM

## 2016-12-01 DIAGNOSIS — R209 Unspecified disturbances of skin sensation: Principal | ICD-10-CM

## 2016-12-01 DIAGNOSIS — IMO0001 Reserved for inherently not codable concepts without codable children: Secondary | ICD-10-CM

## 2016-12-06 ENCOUNTER — Ambulatory Visit
Admission: RE | Admit: 2016-12-06 | Discharge: 2016-12-06 | Disposition: A | Discharge status: Home / Self Care | Payer: Managed Care, Other (non HMO) | Source: Ambulatory Visit | Attending: Physician Assistant | Admitting: Physician Assistant

## 2016-12-06 DIAGNOSIS — I6523 Occlusion and stenosis of bilateral carotid arteries: Secondary | ICD-10-CM | POA: Diagnosis not present

## 2016-12-06 DIAGNOSIS — R55 Syncope and collapse: Secondary | ICD-10-CM

## 2016-12-14 DIAGNOSIS — M79675 Pain in left toe(s): Secondary | ICD-10-CM | POA: Diagnosis not present

## 2017-01-05 DIAGNOSIS — J309 Allergic rhinitis, unspecified: Secondary | ICD-10-CM | POA: Diagnosis not present

## 2017-01-05 DIAGNOSIS — R05 Cough: Secondary | ICD-10-CM | POA: Diagnosis not present

## 2017-01-31 ENCOUNTER — Ambulatory Visit
Admission: RE | Admit: 2017-01-31 | Discharge: 2017-01-31 | Disposition: A | Discharge status: Home / Self Care | Payer: BLUE CROSS/BLUE SHIELD | Source: Ambulatory Visit | Attending: Physician Assistant | Admitting: Physician Assistant

## 2017-01-31 DIAGNOSIS — R209 Unspecified disturbances of skin sensation: Principal | ICD-10-CM

## 2017-01-31 DIAGNOSIS — M542 Cervicalgia: Secondary | ICD-10-CM

## 2017-01-31 DIAGNOSIS — M5030 Other cervical disc degeneration, unspecified cervical region: Secondary | ICD-10-CM | POA: Diagnosis not present

## 2017-01-31 DIAGNOSIS — IMO0001 Reserved for inherently not codable concepts without codable children: Secondary | ICD-10-CM

## 2017-02-08 DIAGNOSIS — E785 Hyperlipidemia, unspecified: Secondary | ICD-10-CM | POA: Diagnosis not present

## 2017-02-08 DIAGNOSIS — Z79899 Other long term (current) drug therapy: Secondary | ICD-10-CM | POA: Diagnosis not present

## 2017-03-21 DIAGNOSIS — J039 Acute tonsillitis, unspecified: Secondary | ICD-10-CM | POA: Diagnosis not present

## 2017-03-21 DIAGNOSIS — R130 Aphagia: Secondary | ICD-10-CM | POA: Diagnosis not present

## 2017-03-21 DIAGNOSIS — E049 Nontoxic goiter, unspecified: Secondary | ICD-10-CM | POA: Diagnosis not present

## 2017-03-21 DIAGNOSIS — R1312 Dysphagia, oropharyngeal phase: Secondary | ICD-10-CM | POA: Diagnosis not present

## 2017-03-22 ENCOUNTER — Other Ambulatory Visit (HOSPITAL_COMMUNITY): Payer: Self-pay | Admitting: Otolaryngology

## 2017-03-22 DIAGNOSIS — E049 Nontoxic goiter, unspecified: Secondary | ICD-10-CM

## 2017-03-22 DIAGNOSIS — R1310 Dysphagia, unspecified: Secondary | ICD-10-CM

## 2017-03-22 DIAGNOSIS — R130 Aphagia: Secondary | ICD-10-CM

## 2017-03-22 DIAGNOSIS — R1312 Dysphagia, oropharyngeal phase: Secondary | ICD-10-CM

## 2017-03-29 DIAGNOSIS — J3501 Chronic tonsillitis: Secondary | ICD-10-CM | POA: Diagnosis not present

## 2017-03-29 DIAGNOSIS — K210 Gastro-esophageal reflux disease with esophagitis: Secondary | ICD-10-CM | POA: Diagnosis not present

## 2017-03-29 DIAGNOSIS — J040 Acute laryngitis: Secondary | ICD-10-CM | POA: Diagnosis not present

## 2017-04-06 ENCOUNTER — Ambulatory Visit (HOSPITAL_COMMUNITY)
Admission: RE | Admit: 2017-04-06 | Discharge: 2017-04-06 | Disposition: A | Discharge status: Home / Self Care | Payer: BLUE CROSS/BLUE SHIELD | Source: Ambulatory Visit | Attending: Otolaryngology | Admitting: Otolaryngology

## 2017-04-06 ENCOUNTER — Encounter (HOSPITAL_COMMUNITY): Payer: Self-pay | Admitting: Radiology

## 2017-04-06 DIAGNOSIS — J370 Chronic laryngitis: Secondary | ICD-10-CM | POA: Diagnosis not present

## 2017-04-06 DIAGNOSIS — R130 Aphagia: Secondary | ICD-10-CM | POA: Diagnosis present

## 2017-04-06 DIAGNOSIS — R1312 Dysphagia, oropharyngeal phase: Secondary | ICD-10-CM | POA: Diagnosis present

## 2017-04-06 DIAGNOSIS — E049 Nontoxic goiter, unspecified: Secondary | ICD-10-CM | POA: Diagnosis not present

## 2017-04-06 DIAGNOSIS — E041 Nontoxic single thyroid nodule: Secondary | ICD-10-CM | POA: Diagnosis not present

## 2017-04-06 MED ORDER — IOPAMIDOL (ISOVUE-300) INJECTION 61%
INTRAVENOUS | Status: AC
  Filled 2017-04-06: qty 75

## 2017-04-06 MED ORDER — IOPAMIDOL (ISOVUE-300) INJECTION 61%
75.0000 mL | Freq: Once | INTRAVENOUS | Status: AC | PRN
  Administered 2017-04-06: 75 mL via INTRAVENOUS

## 2017-04-18 DIAGNOSIS — J3501 Chronic tonsillitis: Secondary | ICD-10-CM | POA: Diagnosis not present

## 2017-04-18 DIAGNOSIS — R1312 Dysphagia, oropharyngeal phase: Secondary | ICD-10-CM | POA: Diagnosis not present

## 2017-07-04 DIAGNOSIS — M25512 Pain in left shoulder: Secondary | ICD-10-CM | POA: Diagnosis not present

## 2017-07-04 DIAGNOSIS — M25562 Pain in left knee: Secondary | ICD-10-CM | POA: Diagnosis not present

## 2017-07-18 DIAGNOSIS — M9903 Segmental and somatic dysfunction of lumbar region: Secondary | ICD-10-CM | POA: Diagnosis not present

## 2017-07-18 DIAGNOSIS — M9904 Segmental and somatic dysfunction of sacral region: Secondary | ICD-10-CM | POA: Diagnosis not present

## 2017-07-18 DIAGNOSIS — M9902 Segmental and somatic dysfunction of thoracic region: Secondary | ICD-10-CM | POA: Diagnosis not present

## 2017-07-18 DIAGNOSIS — M9901 Segmental and somatic dysfunction of cervical region: Secondary | ICD-10-CM | POA: Diagnosis not present

## 2017-07-19 DIAGNOSIS — M9902 Segmental and somatic dysfunction of thoracic region: Secondary | ICD-10-CM | POA: Diagnosis not present

## 2017-07-19 DIAGNOSIS — M9904 Segmental and somatic dysfunction of sacral region: Secondary | ICD-10-CM | POA: Diagnosis not present

## 2017-07-19 DIAGNOSIS — M9903 Segmental and somatic dysfunction of lumbar region: Secondary | ICD-10-CM | POA: Diagnosis not present

## 2017-07-19 DIAGNOSIS — M9901 Segmental and somatic dysfunction of cervical region: Secondary | ICD-10-CM | POA: Diagnosis not present

## 2017-07-20 DIAGNOSIS — M9903 Segmental and somatic dysfunction of lumbar region: Secondary | ICD-10-CM | POA: Diagnosis not present

## 2017-07-20 DIAGNOSIS — M9904 Segmental and somatic dysfunction of sacral region: Secondary | ICD-10-CM | POA: Diagnosis not present

## 2017-07-20 DIAGNOSIS — M9901 Segmental and somatic dysfunction of cervical region: Secondary | ICD-10-CM | POA: Diagnosis not present

## 2017-07-20 DIAGNOSIS — M9902 Segmental and somatic dysfunction of thoracic region: Secondary | ICD-10-CM | POA: Diagnosis not present

## 2017-07-21 DIAGNOSIS — M9903 Segmental and somatic dysfunction of lumbar region: Secondary | ICD-10-CM | POA: Diagnosis not present

## 2017-07-21 DIAGNOSIS — M9901 Segmental and somatic dysfunction of cervical region: Secondary | ICD-10-CM | POA: Diagnosis not present

## 2017-07-21 DIAGNOSIS — M9904 Segmental and somatic dysfunction of sacral region: Secondary | ICD-10-CM | POA: Diagnosis not present

## 2017-07-21 DIAGNOSIS — M9902 Segmental and somatic dysfunction of thoracic region: Secondary | ICD-10-CM | POA: Diagnosis not present

## 2017-07-25 DIAGNOSIS — M9901 Segmental and somatic dysfunction of cervical region: Secondary | ICD-10-CM | POA: Diagnosis not present

## 2017-07-25 DIAGNOSIS — M9902 Segmental and somatic dysfunction of thoracic region: Secondary | ICD-10-CM | POA: Diagnosis not present

## 2017-07-25 DIAGNOSIS — M9903 Segmental and somatic dysfunction of lumbar region: Secondary | ICD-10-CM | POA: Diagnosis not present

## 2017-07-25 DIAGNOSIS — M9904 Segmental and somatic dysfunction of sacral region: Secondary | ICD-10-CM | POA: Diagnosis not present

## 2017-07-26 ENCOUNTER — Ambulatory Visit: Payer: BLUE CROSS/BLUE SHIELD | Attending: Orthopaedic Surgery | Admitting: Physical Therapy

## 2017-07-26 ENCOUNTER — Encounter: Payer: Self-pay | Admitting: Physical Therapy

## 2017-07-26 ENCOUNTER — Other Ambulatory Visit: Payer: Self-pay

## 2017-07-26 DIAGNOSIS — M79652 Pain in left thigh: Secondary | ICD-10-CM | POA: Diagnosis not present

## 2017-07-26 DIAGNOSIS — M9901 Segmental and somatic dysfunction of cervical region: Secondary | ICD-10-CM | POA: Diagnosis not present

## 2017-07-26 DIAGNOSIS — R262 Difficulty in walking, not elsewhere classified: Secondary | ICD-10-CM | POA: Diagnosis not present

## 2017-07-26 DIAGNOSIS — M9903 Segmental and somatic dysfunction of lumbar region: Secondary | ICD-10-CM | POA: Diagnosis not present

## 2017-07-26 DIAGNOSIS — M9902 Segmental and somatic dysfunction of thoracic region: Secondary | ICD-10-CM | POA: Diagnosis not present

## 2017-07-26 DIAGNOSIS — M25652 Stiffness of left hip, not elsewhere classified: Secondary | ICD-10-CM

## 2017-07-26 DIAGNOSIS — M9904 Segmental and somatic dysfunction of sacral region: Secondary | ICD-10-CM | POA: Diagnosis not present

## 2017-07-26 DIAGNOSIS — M6281 Muscle weakness (generalized): Secondary | ICD-10-CM | POA: Insufficient documentation

## 2017-07-26 NOTE — Therapy (Addendum)
[facility] [address] [place], [place], [address] Phone: [phone]   Fax:  [phone]  Physical Therapy Evaluation/Discharge Summary   Patient Details  Name: Steven Hendricks MRN: [id number] Date of Birth: 08/29/1965 Referring Provider: Dr. Rhona Raider   Encounter Date: 07/26/2017  PT End of Session - 07/26/17 1850    Visit Number  1    Date for PT Re-Evaluation  09/20/17    Authorization Type  [employer]    PT Start Time  0845    PT Stop Time  0930    PT Time Calculation (min)  45 min    Activity Tolerance  Patient tolerated treatment well       Past Medical History:  Diagnosis Date  . Asthma   . Diverticulosis   . Hemorrhoids   . History of hernia repair    right  . IBS (irritable bowel syndrome)   . Ulnar nerve compression     Past Surgical History:  Procedure Laterality Date  . COLONOSCOPY    . HERNIA REPAIR      There were no vitals filed for this visit.   Subjective Assessment - 07/26/17 0848    Subjective  Cummulative problem.   In early April, HS region pain left side.  Unable to ex.  Went on vacation to hike.  No issues.  After return from vacation worsened.  More knee and HS region discomfort.  A little LBP.  Difficulty sleeping.  Unable to do 30 min of stretching routine used to do every morning.      Pertinent History  chronic knee pain ;  chronic LBP  ;  Left AC joint issues;  has seen chiro for "adjustments"  requested he hold chiro treatment for back, hip, knee while doing PT    Limitations  House hold activities;Walking    How long can you sit comfortably?  upon rising will be painful     How long can you walk comfortably?  not at all comfortably    Diagnostic tests  left knee x-ray     Patient Stated Goals  I would like to be mobile and back to physically    Currently in Pain?  Yes    Pain Score  4     Pain Location  Knee    Pain Orientation  Left    Pain Type  Acute pain    Pain  Onset  More than a month ago    Pain Frequency  Intermittent    Aggravating Factors   night time;  walking; rising    Pain Relieving Factors  hot shower; stretching but doesn't help          [facility] PT Assessment - 07/26/17 0001      Assessment   Medical Diagnosis  LB and HS pain left knee    Referring Provider  Dr. Rhona Raider    Onset Date/Surgical Date  -- early April     Next MD Visit  August     Prior Therapy  knee 5 years ago       Precautions   Precautions  None      Restrictions   Weight Bearing Restrictions  No      Balance Screen   Has the patient fallen in the past 6 months  No    Has the patient had a decrease in activity level because of a fear of falling?   No    Is the patient reluctant to  leave their home because of a fear of falling?   No      Home Film/video editor residence    Living Arrangements  Spouse/significant other    Home Access  Stairs to enter    Home Layout  Two level      Prior Function   Vocation  Full time employment    Vocation Requirements  sitting     [place], projects around the house limited       Observation/Other Assessments   Focus on Therapeutic Outcomes (FOTO)   55% limitation       Posture/Postural Control   Posture/Postural Control  Postural limitations    Postural Limitations  Decreased lumbar lordosis      AROM   Right Hip Extension  5    Right Hip Flexion  120    Right Hip External Rotation   40    Right Hip Internal Rotation   20    Left Hip Extension  5    Left Hip Flexion  120    Left Hip External Rotation   30 LB/hip discomfort     Left Hip Internal Rotation   10    Right Knee Extension  0    Right Knee Flexion  140    Left Knee Extension  0    Left Knee Flexion  140 endrange knee pain     Lumbar Flexion  60 painful left HS    Lumbar Extension  20    Lumbar - Right Side Bend  35    Lumbar - Left Side Bend  40      Strength   Right Hip Flexion  5/5    Right Hip  Extension  5/5    Right Hip External Rotation   5/5    Right Hip Internal Rotation  5/5    Right Hip ABduction  4+/5    Left Hip Flexion  4/5    Left Hip Extension  4/5    Left Hip External Rotation  4/5    Left Hip Internal Rotation  4/5    Left Hip ABduction  4/5    Right Knee Flexion  5/5    Right Knee Extension  5/5    Left Knee Flexion  4/5    Left Knee Extension  4/5    Lumbar Flexion  4/5    Lumbar Extension  4/5      Flexibility   Hamstrings  pain at left 60 degrees, right 90 degrees with ease      Palpation   Palpation comment  tender points medial HS      Slump test   Findings  Negative    Comment  increase "pull" in HS on left vs. right       Prone Knee Bend Test   Findings  Negative      Straight Leg Raise   Findings  Negative      Trendelenburg Test   Comments  difficulty stabilizing with left SLS      Thomas Test    Findings  Negative      Hip Scouring   Findings  Negative                Objective measurements completed on examination: See above findings.              PT Education - 07/26/17 1850    Education Details  supine and seated neural flossing on  left    Person(s) Educated  Patient    Methods  Explanation;Demonstration;Handout    Comprehension  Returned demonstration;Verbalized understanding       PT Short Term Goals - 07/26/17 2132      PT SHORT TERM GOAL #1   Title  The patient will be able to perform low level hip, knee ROM exercises to promote healing    Time  4    Period  Weeks    Status  New    Target Date  08/23/17      PT SHORT TERM GOAL #2   Title  The patient will report a 30% improvement in pain at night time, walking and with sit to stand    Time  4    Period  Weeks    Status  New      PT SHORT TERM GOAL #3   Title  The patient will be able to walk 5-10 comfortably with minimal pain in HS region    Time  4    Period  Weeks    Status  New      PT SHORT TERM GOAL #4   Title  Left hip  external rotation to 35 degrees and internal rotation to 15 degrees needed for greater ease with transitional movements    Time  4    Period  Weeks    Status  New        PT Long Term Goals - 07/26/17 2137      PT LONG TERM GOAL #1   Title  The patient will be able to return to a comprensive HEP for ROM and strength     Time  8    Period  Weeks    Status  New    Target Date  09/20/17      PT LONG TERM GOAL #2   Title  The patient will report a 60% improvement in HS region pain at night time, with walking and sit to stand    Time  8    Period  Weeks    Status  New      PT LONG TERM GOAL #3   Title  Left core, hip and knee strength grossly 4+/5 needed for greater ease descending steps and with sit to stand    Time  8    Period  Weeks    Status  New      PT LONG TERM GOAL #4   Title  The patient will be able to walk 15-20 minutes with minimal pain in HS region    Time  8    Period  Weeks    Status  New      PT LONG TERM GOAL #5   Title  FOTO functional outcome score improved from 55% limitation to 36% indicating improved function with less pain    Time  8    Period  Weeks    Status  New             Plan - 07/26/17 1853    Clinical Impression Statement  The patient reports he previously performed a stretching routine about 30 minutes a day to manage back and left knee pain however in early April he had the onset of distal HS region pain.   Since that time he has had to discontinue exercising.  He reports he is unable to walk any length of time comfortably.  He also has a lot of pain at night time and  with rising from sit to stand after prolonged sitting.  Since this HS region pain began his other chronic  joint problems have been exacerbated.  Increased pain with forward bending and HS lengthening movements.  Decreased left hip ROM especially internal and external rotation.  Decreased lumbar, hip and knee strength grossly 4/5.  Multiple tender points in medial HS.  He  would benefit from PT to address these deficits.      History and Personal Factors relevant to plan of care:  previous history of back pain, left knee pain; left foot pain;  shoulder pain but no other co-morbidities;  good home support    Clinical Presentation  Stable    Clinical Decision Making  Low    Rehab Potential  Good    Clinical Impairments Affecting Rehab Potential  chronic pain    PT Frequency  2x / week    PT Duration  8 weeks    PT Treatment/Interventions  ADLs/Self Care Home Management;Cryotherapy;Electrical Stimulation;Ultrasound;Moist Heat;Iontophoresis '4mg'$ /ml Dexamethasone;Neuromuscular re-education;Therapeutic activities;Therapeutic exercise;Patient/family education;Manual techniques;Taping;Dry needling    PT Next Visit Plan  DN medial HS;  [place] and manual therapy;  review neural floss;  add UE/LE movements with abdominal brace    Consulted and Agree with Plan of Care  Patient       Patient will benefit from skilled therapeutic intervention in order to improve the following deficits and impairments:  Pain, Decreased range of motion, Decreased activity tolerance, Decreased strength, Impaired perceived functional ability, Difficulty walking  Visit Diagnosis: Pain in left thigh - Plan: PT plan of care cert/re-cert  Stiffness of left hip, not elsewhere classified - Plan: PT plan of care cert/re-cert  Muscle weakness (generalized) - Plan: PT plan of care cert/re-cert  Difficulty in walking, not elsewhere classified - Plan: PT plan of care cert/re-cert   PHYSICAL THERAPY DISCHARGE SUMMARY  Visits from Start of Care: 1  Current functional level related to goals / functional outcomes: The patient did not return after initial evaluation.   Remaining deficits: As above   Education / Equipment:  Plan:                                                    Patient goals were not met. Patient is being discharged due to not returning since the last visit.  ?????           Problem List There are no active problems to display for this patient.  Steven Hendricks, PT 07/26/17 9:43 PM Phone: [phone] Fax: [phone]  Steven Hendricks 07/26/2017, 9:42 PM  [facility] [address], [place], [place], [address] Phone: [phone]   Fax:  [phone]  Name: Steven Hendricks MRN: [id number] Date of Birth: November 29, 1965

## 2017-07-26 NOTE — Patient Instructions (Addendum)
  Supine neural flossing 10x daily  Seated neural flossing 10x daily     Lavinia SharpsStacy Morrisa Aldaba PT [facility] [address], [address] [place], [place] [address] Phone # [phone] Fax [phone]

## 2017-07-27 DIAGNOSIS — M9903 Segmental and somatic dysfunction of lumbar region: Secondary | ICD-10-CM | POA: Diagnosis not present

## 2017-07-27 DIAGNOSIS — M9904 Segmental and somatic dysfunction of sacral region: Secondary | ICD-10-CM | POA: Diagnosis not present

## 2017-07-27 DIAGNOSIS — M9901 Segmental and somatic dysfunction of cervical region: Secondary | ICD-10-CM | POA: Diagnosis not present

## 2017-07-27 DIAGNOSIS — M9902 Segmental and somatic dysfunction of thoracic region: Secondary | ICD-10-CM | POA: Diagnosis not present

## 2017-07-28 DIAGNOSIS — M25512 Pain in left shoulder: Secondary | ICD-10-CM | POA: Diagnosis not present

## 2017-08-01 DIAGNOSIS — M9901 Segmental and somatic dysfunction of cervical region: Secondary | ICD-10-CM | POA: Diagnosis not present

## 2017-08-01 DIAGNOSIS — M9903 Segmental and somatic dysfunction of lumbar region: Secondary | ICD-10-CM | POA: Diagnosis not present

## 2017-08-01 DIAGNOSIS — M9904 Segmental and somatic dysfunction of sacral region: Secondary | ICD-10-CM | POA: Diagnosis not present

## 2017-08-01 DIAGNOSIS — M9902 Segmental and somatic dysfunction of thoracic region: Secondary | ICD-10-CM | POA: Diagnosis not present

## 2017-08-02 DIAGNOSIS — M9903 Segmental and somatic dysfunction of lumbar region: Secondary | ICD-10-CM | POA: Diagnosis not present

## 2017-08-02 DIAGNOSIS — M9904 Segmental and somatic dysfunction of sacral region: Secondary | ICD-10-CM | POA: Diagnosis not present

## 2017-08-02 DIAGNOSIS — M9901 Segmental and somatic dysfunction of cervical region: Secondary | ICD-10-CM | POA: Diagnosis not present

## 2017-08-02 DIAGNOSIS — M9902 Segmental and somatic dysfunction of thoracic region: Secondary | ICD-10-CM | POA: Diagnosis not present

## 2017-08-05 DIAGNOSIS — M9903 Segmental and somatic dysfunction of lumbar region: Secondary | ICD-10-CM | POA: Diagnosis not present

## 2017-08-05 DIAGNOSIS — M9901 Segmental and somatic dysfunction of cervical region: Secondary | ICD-10-CM | POA: Diagnosis not present

## 2017-08-05 DIAGNOSIS — M9904 Segmental and somatic dysfunction of sacral region: Secondary | ICD-10-CM | POA: Diagnosis not present

## 2017-08-05 DIAGNOSIS — M9902 Segmental and somatic dysfunction of thoracic region: Secondary | ICD-10-CM | POA: Diagnosis not present

## 2017-08-08 DIAGNOSIS — M9903 Segmental and somatic dysfunction of lumbar region: Secondary | ICD-10-CM | POA: Diagnosis not present

## 2017-08-08 DIAGNOSIS — M9901 Segmental and somatic dysfunction of cervical region: Secondary | ICD-10-CM | POA: Diagnosis not present

## 2017-08-08 DIAGNOSIS — M9904 Segmental and somatic dysfunction of sacral region: Secondary | ICD-10-CM | POA: Diagnosis not present

## 2017-08-08 DIAGNOSIS — M9902 Segmental and somatic dysfunction of thoracic region: Secondary | ICD-10-CM | POA: Diagnosis not present

## 2017-08-09 DIAGNOSIS — E785 Hyperlipidemia, unspecified: Secondary | ICD-10-CM | POA: Diagnosis not present

## 2017-08-09 DIAGNOSIS — M2550 Pain in unspecified joint: Secondary | ICD-10-CM | POA: Diagnosis not present

## 2017-08-10 DIAGNOSIS — M9901 Segmental and somatic dysfunction of cervical region: Secondary | ICD-10-CM | POA: Diagnosis not present

## 2017-08-10 DIAGNOSIS — M9904 Segmental and somatic dysfunction of sacral region: Secondary | ICD-10-CM | POA: Diagnosis not present

## 2017-08-10 DIAGNOSIS — M9902 Segmental and somatic dysfunction of thoracic region: Secondary | ICD-10-CM | POA: Diagnosis not present

## 2017-08-10 DIAGNOSIS — M9903 Segmental and somatic dysfunction of lumbar region: Secondary | ICD-10-CM | POA: Diagnosis not present

## 2017-08-12 DIAGNOSIS — E785 Hyperlipidemia, unspecified: Secondary | ICD-10-CM | POA: Diagnosis not present

## 2017-08-15 DIAGNOSIS — E559 Vitamin D deficiency, unspecified: Secondary | ICD-10-CM | POA: Diagnosis not present

## 2017-08-15 DIAGNOSIS — E785 Hyperlipidemia, unspecified: Secondary | ICD-10-CM | POA: Diagnosis not present

## 2017-08-15 DIAGNOSIS — M2550 Pain in unspecified joint: Secondary | ICD-10-CM | POA: Diagnosis not present

## 2017-08-15 DIAGNOSIS — R5383 Other fatigue: Secondary | ICD-10-CM | POA: Diagnosis not present

## 2017-08-16 DIAGNOSIS — M9902 Segmental and somatic dysfunction of thoracic region: Secondary | ICD-10-CM | POA: Diagnosis not present

## 2017-08-16 DIAGNOSIS — M9904 Segmental and somatic dysfunction of sacral region: Secondary | ICD-10-CM | POA: Diagnosis not present

## 2017-08-16 DIAGNOSIS — M9903 Segmental and somatic dysfunction of lumbar region: Secondary | ICD-10-CM | POA: Diagnosis not present

## 2017-08-16 DIAGNOSIS — M9901 Segmental and somatic dysfunction of cervical region: Secondary | ICD-10-CM | POA: Diagnosis not present

## 2017-08-18 DIAGNOSIS — M9901 Segmental and somatic dysfunction of cervical region: Secondary | ICD-10-CM | POA: Diagnosis not present

## 2017-08-18 DIAGNOSIS — M9904 Segmental and somatic dysfunction of sacral region: Secondary | ICD-10-CM | POA: Diagnosis not present

## 2017-08-18 DIAGNOSIS — M9903 Segmental and somatic dysfunction of lumbar region: Secondary | ICD-10-CM | POA: Diagnosis not present

## 2017-08-18 DIAGNOSIS — M9902 Segmental and somatic dysfunction of thoracic region: Secondary | ICD-10-CM | POA: Diagnosis not present

## 2017-08-26 DIAGNOSIS — M9904 Segmental and somatic dysfunction of sacral region: Secondary | ICD-10-CM | POA: Diagnosis not present

## 2017-08-26 DIAGNOSIS — M9901 Segmental and somatic dysfunction of cervical region: Secondary | ICD-10-CM | POA: Diagnosis not present

## 2017-08-26 DIAGNOSIS — M9903 Segmental and somatic dysfunction of lumbar region: Secondary | ICD-10-CM | POA: Diagnosis not present

## 2017-08-26 DIAGNOSIS — M9902 Segmental and somatic dysfunction of thoracic region: Secondary | ICD-10-CM | POA: Diagnosis not present

## 2017-08-29 DIAGNOSIS — M9902 Segmental and somatic dysfunction of thoracic region: Secondary | ICD-10-CM | POA: Diagnosis not present

## 2017-08-29 DIAGNOSIS — M9901 Segmental and somatic dysfunction of cervical region: Secondary | ICD-10-CM | POA: Diagnosis not present

## 2017-08-29 DIAGNOSIS — M9904 Segmental and somatic dysfunction of sacral region: Secondary | ICD-10-CM | POA: Diagnosis not present

## 2017-08-29 DIAGNOSIS — M9903 Segmental and somatic dysfunction of lumbar region: Secondary | ICD-10-CM | POA: Diagnosis not present

## 2017-08-31 DIAGNOSIS — M9902 Segmental and somatic dysfunction of thoracic region: Secondary | ICD-10-CM | POA: Diagnosis not present

## 2017-08-31 DIAGNOSIS — M9903 Segmental and somatic dysfunction of lumbar region: Secondary | ICD-10-CM | POA: Diagnosis not present

## 2017-08-31 DIAGNOSIS — M9904 Segmental and somatic dysfunction of sacral region: Secondary | ICD-10-CM | POA: Diagnosis not present

## 2017-08-31 DIAGNOSIS — M9901 Segmental and somatic dysfunction of cervical region: Secondary | ICD-10-CM | POA: Diagnosis not present

## 2017-08-31 DIAGNOSIS — M25512 Pain in left shoulder: Secondary | ICD-10-CM | POA: Diagnosis not present

## 2017-09-06 DIAGNOSIS — M9901 Segmental and somatic dysfunction of cervical region: Secondary | ICD-10-CM | POA: Diagnosis not present

## 2017-09-06 DIAGNOSIS — M9904 Segmental and somatic dysfunction of sacral region: Secondary | ICD-10-CM | POA: Diagnosis not present

## 2017-09-06 DIAGNOSIS — M9903 Segmental and somatic dysfunction of lumbar region: Secondary | ICD-10-CM | POA: Diagnosis not present

## 2017-09-06 DIAGNOSIS — M9902 Segmental and somatic dysfunction of thoracic region: Secondary | ICD-10-CM | POA: Diagnosis not present

## 2017-09-07 DIAGNOSIS — G8929 Other chronic pain: Secondary | ICD-10-CM | POA: Diagnosis not present

## 2017-09-07 DIAGNOSIS — R5382 Chronic fatigue, unspecified: Secondary | ICD-10-CM | POA: Diagnosis not present

## 2017-09-07 DIAGNOSIS — M533 Sacrococcygeal disorders, not elsewhere classified: Secondary | ICD-10-CM | POA: Diagnosis not present

## 2017-09-07 DIAGNOSIS — M2550 Pain in unspecified joint: Secondary | ICD-10-CM | POA: Diagnosis not present

## 2017-09-08 DIAGNOSIS — M9903 Segmental and somatic dysfunction of lumbar region: Secondary | ICD-10-CM | POA: Diagnosis not present

## 2017-09-08 DIAGNOSIS — M9904 Segmental and somatic dysfunction of sacral region: Secondary | ICD-10-CM | POA: Diagnosis not present

## 2017-09-08 DIAGNOSIS — M9902 Segmental and somatic dysfunction of thoracic region: Secondary | ICD-10-CM | POA: Diagnosis not present

## 2017-09-08 DIAGNOSIS — M9901 Segmental and somatic dysfunction of cervical region: Secondary | ICD-10-CM | POA: Diagnosis not present

## 2017-09-12 DIAGNOSIS — M9901 Segmental and somatic dysfunction of cervical region: Secondary | ICD-10-CM | POA: Diagnosis not present

## 2017-09-12 DIAGNOSIS — M9902 Segmental and somatic dysfunction of thoracic region: Secondary | ICD-10-CM | POA: Diagnosis not present

## 2017-09-12 DIAGNOSIS — M9904 Segmental and somatic dysfunction of sacral region: Secondary | ICD-10-CM | POA: Diagnosis not present

## 2017-09-12 DIAGNOSIS — M9903 Segmental and somatic dysfunction of lumbar region: Secondary | ICD-10-CM | POA: Diagnosis not present

## 2017-09-14 DIAGNOSIS — M9901 Segmental and somatic dysfunction of cervical region: Secondary | ICD-10-CM | POA: Diagnosis not present

## 2017-09-14 DIAGNOSIS — M9902 Segmental and somatic dysfunction of thoracic region: Secondary | ICD-10-CM | POA: Diagnosis not present

## 2017-09-14 DIAGNOSIS — M9903 Segmental and somatic dysfunction of lumbar region: Secondary | ICD-10-CM | POA: Diagnosis not present

## 2017-09-14 DIAGNOSIS — M9904 Segmental and somatic dysfunction of sacral region: Secondary | ICD-10-CM | POA: Diagnosis not present

## 2017-10-05 DIAGNOSIS — G8929 Other chronic pain: Secondary | ICD-10-CM | POA: Diagnosis not present

## 2017-10-05 DIAGNOSIS — M0609 Rheumatoid arthritis without rheumatoid factor, multiple sites: Secondary | ICD-10-CM | POA: Diagnosis not present

## 2017-10-05 DIAGNOSIS — M2550 Pain in unspecified joint: Secondary | ICD-10-CM | POA: Diagnosis not present

## 2017-10-11 DIAGNOSIS — M2550 Pain in unspecified joint: Secondary | ICD-10-CM | POA: Diagnosis not present

## 2017-10-11 DIAGNOSIS — E785 Hyperlipidemia, unspecified: Secondary | ICD-10-CM | POA: Diagnosis not present

## 2017-10-11 DIAGNOSIS — E559 Vitamin D deficiency, unspecified: Secondary | ICD-10-CM | POA: Diagnosis not present

## 2017-10-11 DIAGNOSIS — R5383 Other fatigue: Secondary | ICD-10-CM | POA: Diagnosis not present

## 2017-11-29 DIAGNOSIS — M2550 Pain in unspecified joint: Secondary | ICD-10-CM | POA: Diagnosis not present

## 2017-12-05 DIAGNOSIS — R1310 Dysphagia, unspecified: Secondary | ICD-10-CM | POA: Diagnosis not present

## 2017-12-05 DIAGNOSIS — Z125 Encounter for screening for malignant neoplasm of prostate: Secondary | ICD-10-CM | POA: Diagnosis not present

## 2017-12-05 DIAGNOSIS — Z0000 Encounter for general adult medical examination without abnormal findings: Secondary | ICD-10-CM | POA: Diagnosis not present

## 2017-12-05 DIAGNOSIS — J9801 Acute bronchospasm: Secondary | ICD-10-CM | POA: Diagnosis not present

## 2017-12-05 DIAGNOSIS — E782 Mixed hyperlipidemia: Secondary | ICD-10-CM | POA: Diagnosis not present

## 2017-12-05 DIAGNOSIS — M069 Rheumatoid arthritis, unspecified: Secondary | ICD-10-CM | POA: Diagnosis not present

## 2017-12-12 DIAGNOSIS — M2550 Pain in unspecified joint: Secondary | ICD-10-CM | POA: Diagnosis not present

## 2017-12-12 DIAGNOSIS — M0609 Rheumatoid arthritis without rheumatoid factor, multiple sites: Secondary | ICD-10-CM | POA: Diagnosis not present

## 2017-12-12 DIAGNOSIS — M533 Sacrococcygeal disorders, not elsewhere classified: Secondary | ICD-10-CM | POA: Diagnosis not present

## 2017-12-14 DIAGNOSIS — M25512 Pain in left shoulder: Secondary | ICD-10-CM | POA: Diagnosis not present

## 2017-12-14 DIAGNOSIS — M25562 Pain in left knee: Secondary | ICD-10-CM | POA: Diagnosis not present

## 2017-12-20 IMAGING — US US CAROTID DUPLEX BILAT
1 series · 14 of 24 positions shown · non-contrast
Comparison: None.

CLINICAL DATA: Presyncope

EXAM:
BILATERAL CAROTID DUPLEX ULTRASOUND
TECHNIQUE: Gray scale imaging, color Doppler and duplex ultrasound were
performed of bilateral carotid and vertebral arteries in the neck.

[Series 1: us carotid duplex bilat · 0.06mm/px · 14 of 45 slices shown]
[im 1/45]
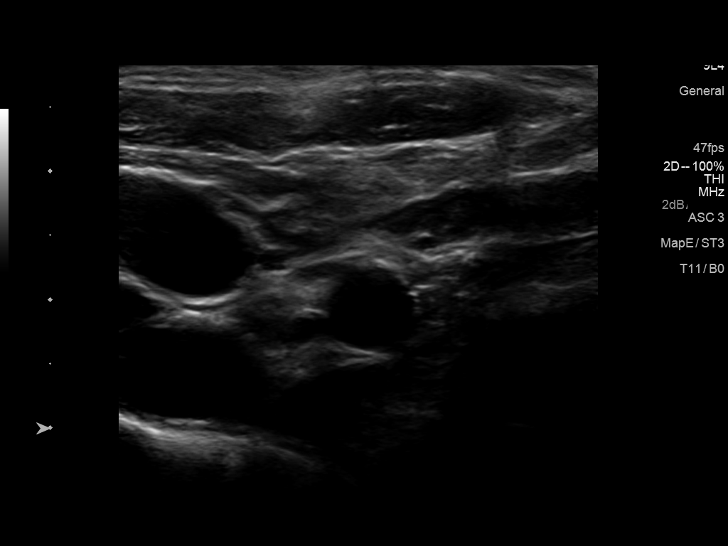
[im 4/45]
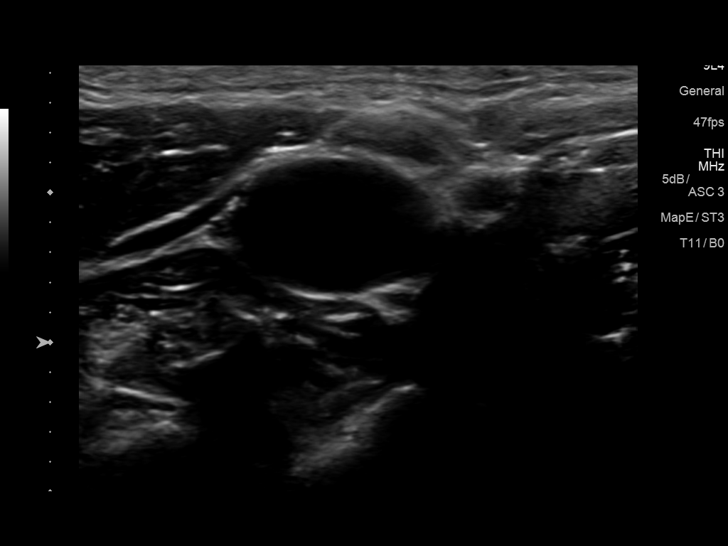
[im 8/45]
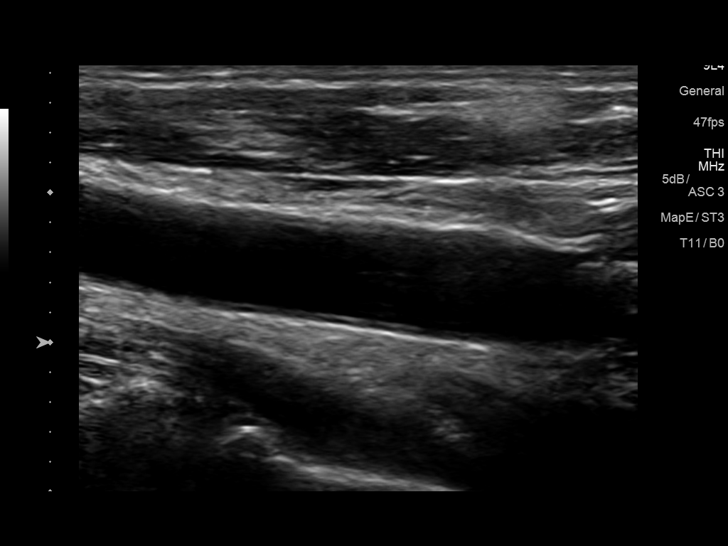
[im 12/45]
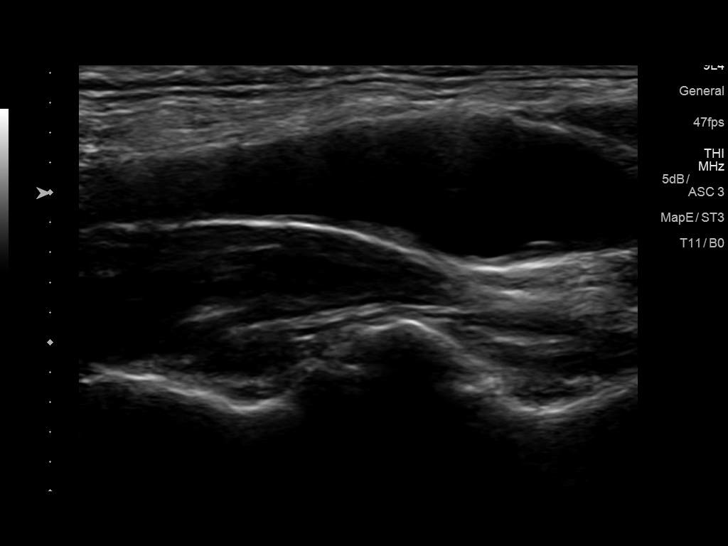
[im 14/45]
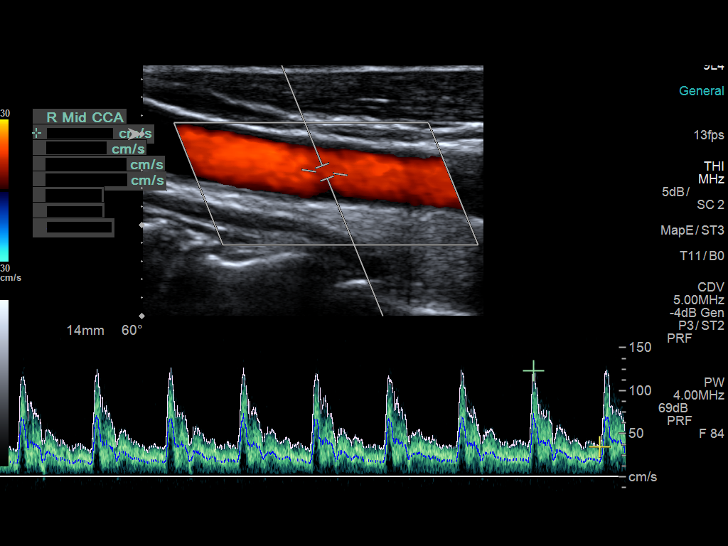
[im 18/45]
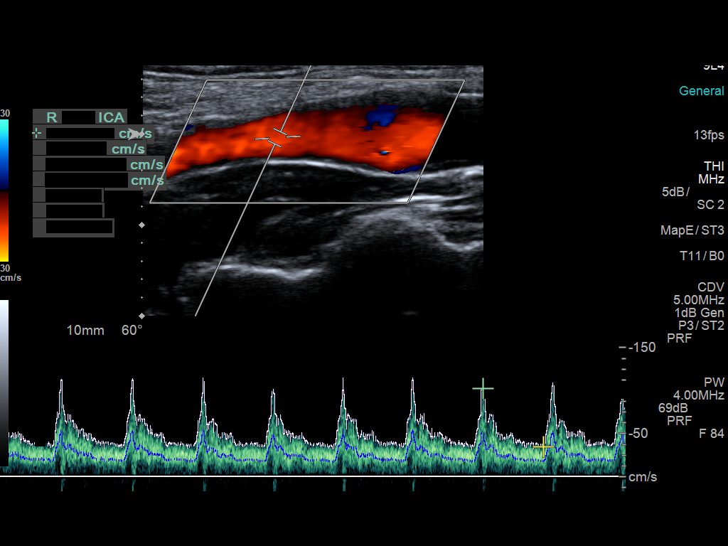
[im 22/45]
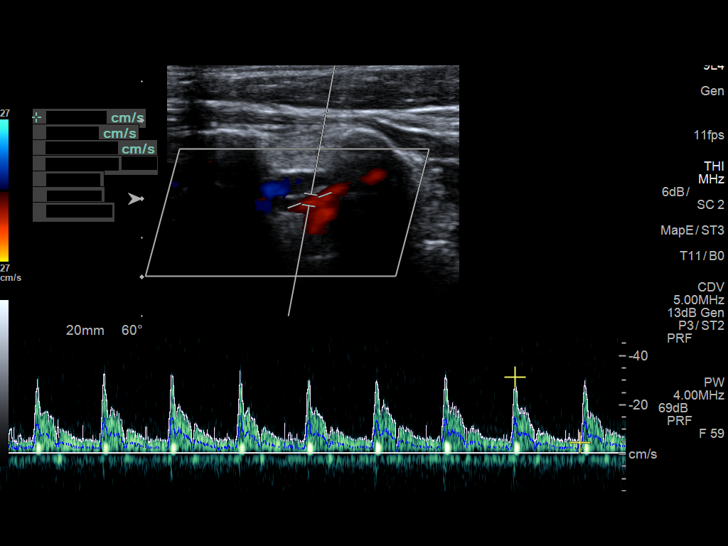
[im 23/45]
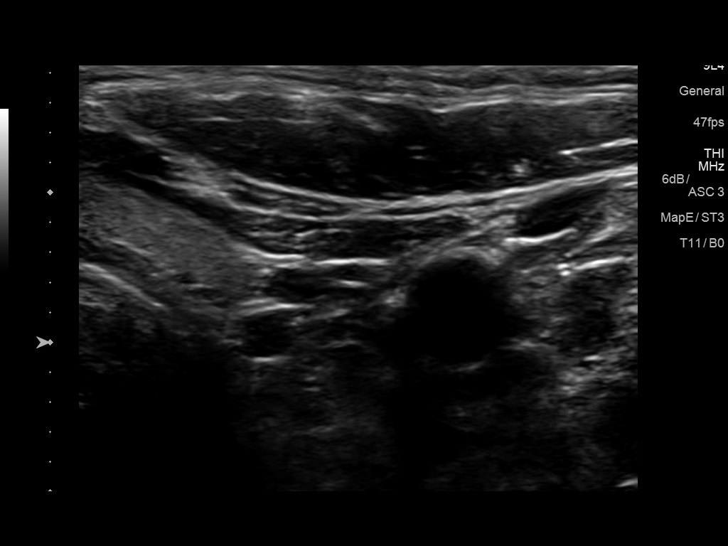
[im 27/45]
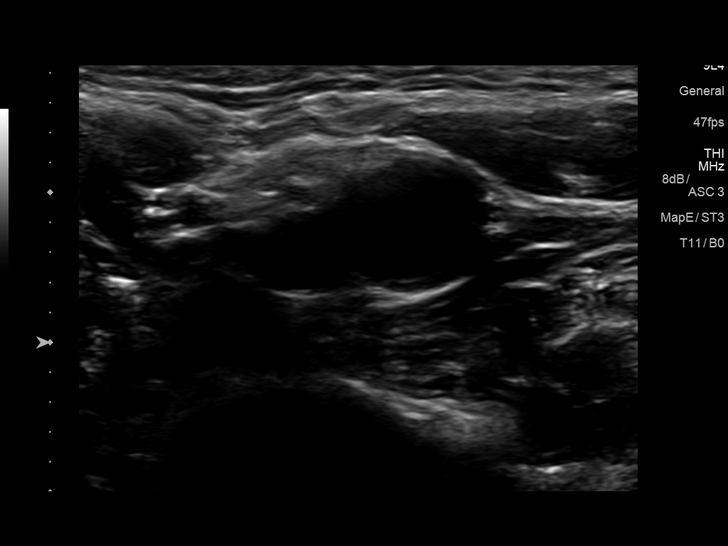
[im 31/45]
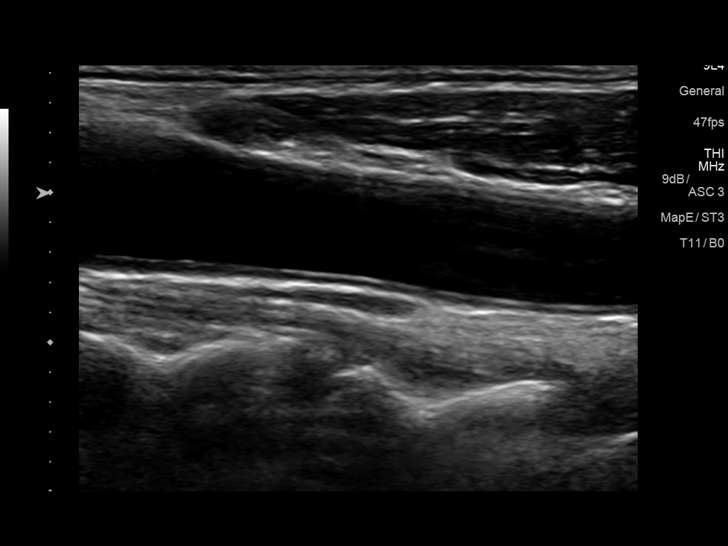
[im 35/45]
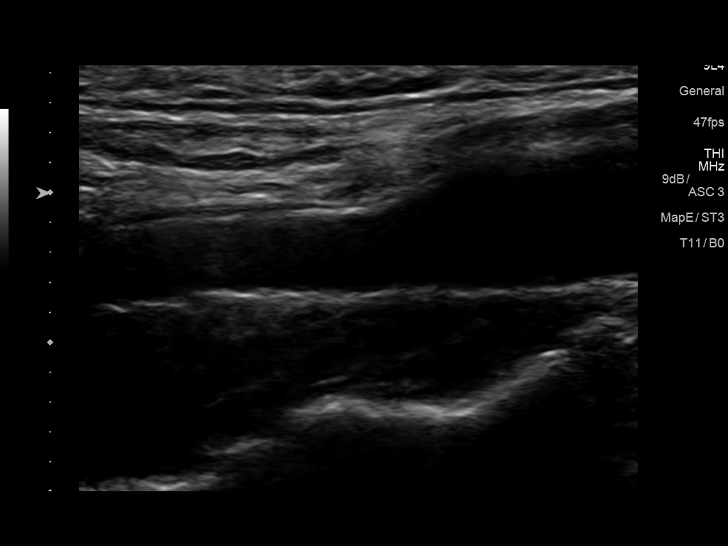
[im 37/45]
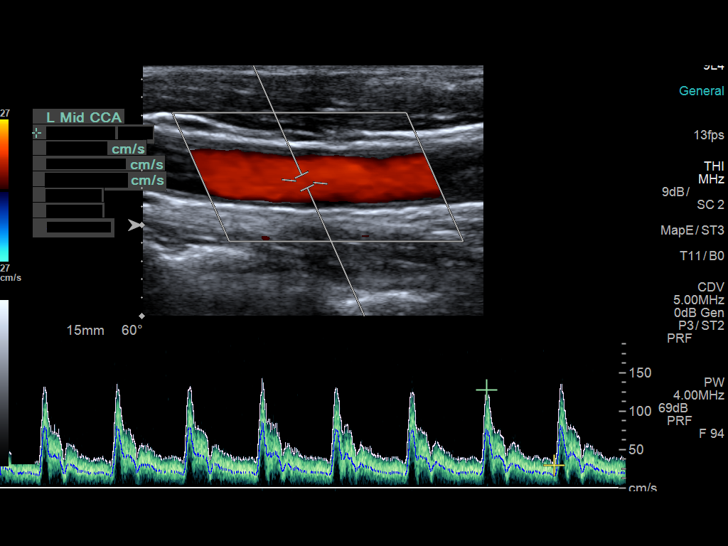
[im 41/45]
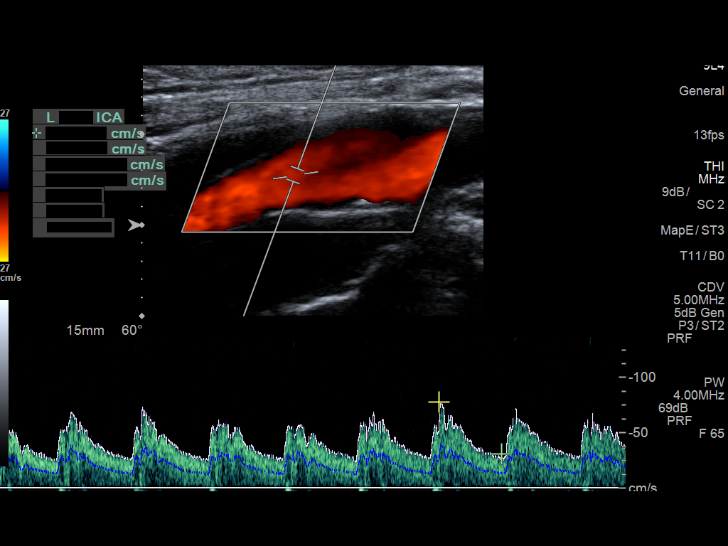
[im 45/45]
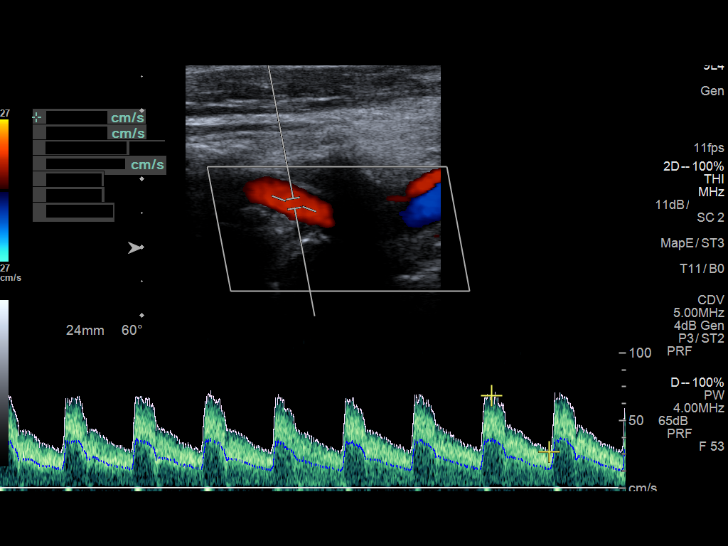

[14 of 24 positions shown; findings below may reference images not displayed]

FINDINGS: Criteria: Quantification of carotid stenosis is based on velocity
parameters that correlate the residual internal carotid diameter
with NASCET-based stenosis levels, using the diameter of the distal
internal carotid lumen as the denominator for stenosis measurement.

The following velocity measurements were obtained:

RIGHT

ICA:  116 cm/sec

CCA:  128 cm/sec

SYSTOLIC ICA/CCA RATIO:

DIASTOLIC ICA/CCA RATIO:

ECA:  131 cm/sec

LEFT

ICA:  93 cm/sec

CCA:  160 cm/sec

SYSTOLIC ICA/CCA RATIO:

DIASTOLIC ICA/CCA RATIO:

ECA:  172 cm/sec

RIGHT CAROTID ARTERY: Little if any plaque in the bulb. Low
resistance internal carotid Doppler pattern.

RIGHT VERTEBRAL ARTERY:  Antegrade.

LEFT CAROTID ARTERY: Little if any plaque in the bulb. Low
resistance internal carotid Doppler pattern.

LEFT VERTEBRAL ARTERY:  Antegrade.
IMPRESSION: Less than 50% stenosis in the right and left internal carotid
arteries.

## 2017-12-22 DIAGNOSIS — M25562 Pain in left knee: Secondary | ICD-10-CM | POA: Diagnosis not present

## 2017-12-22 DIAGNOSIS — M25512 Pain in left shoulder: Secondary | ICD-10-CM | POA: Diagnosis not present

## 2017-12-26 DIAGNOSIS — M353 Polymyalgia rheumatica: Secondary | ICD-10-CM | POA: Diagnosis not present

## 2017-12-26 DIAGNOSIS — M79642 Pain in left hand: Secondary | ICD-10-CM | POA: Diagnosis not present

## 2017-12-26 DIAGNOSIS — M2550 Pain in unspecified joint: Secondary | ICD-10-CM | POA: Diagnosis not present

## 2017-12-26 DIAGNOSIS — M79671 Pain in right foot: Secondary | ICD-10-CM | POA: Diagnosis not present

## 2017-12-26 DIAGNOSIS — M25512 Pain in left shoulder: Secondary | ICD-10-CM | POA: Diagnosis not present

## 2017-12-26 DIAGNOSIS — M19072 Primary osteoarthritis, left ankle and foot: Secondary | ICD-10-CM | POA: Diagnosis not present

## 2017-12-26 DIAGNOSIS — M19071 Primary osteoarthritis, right ankle and foot: Secondary | ICD-10-CM | POA: Diagnosis not present

## 2017-12-26 DIAGNOSIS — M79672 Pain in left foot: Secondary | ICD-10-CM | POA: Diagnosis not present

## 2017-12-26 DIAGNOSIS — M79641 Pain in right hand: Secondary | ICD-10-CM | POA: Diagnosis not present

## 2017-12-26 DIAGNOSIS — M25562 Pain in left knee: Secondary | ICD-10-CM | POA: Diagnosis not present

## 2018-01-26 DIAGNOSIS — M25512 Pain in left shoulder: Secondary | ICD-10-CM | POA: Diagnosis not present

## 2018-01-26 DIAGNOSIS — M75101 Unspecified rotator cuff tear or rupture of right shoulder, not specified as traumatic: Secondary | ICD-10-CM | POA: Diagnosis not present

## 2018-01-26 DIAGNOSIS — M7581 Other shoulder lesions, right shoulder: Secondary | ICD-10-CM | POA: Diagnosis not present

## 2018-07-31 DIAGNOSIS — M160 Bilateral primary osteoarthritis of hip: Secondary | ICD-10-CM | POA: Diagnosis not present

## 2018-07-31 DIAGNOSIS — Z791 Long term (current) use of non-steroidal anti-inflammatories (NSAID): Secondary | ICD-10-CM | POA: Diagnosis not present

## 2018-07-31 DIAGNOSIS — M7582 Other shoulder lesions, left shoulder: Secondary | ICD-10-CM | POA: Diagnosis not present

## 2018-07-31 DIAGNOSIS — M7590 Shoulder lesion, unspecified, unspecified shoulder: Secondary | ICD-10-CM | POA: Diagnosis not present

## 2018-07-31 DIAGNOSIS — M5031 Other cervical disc degeneration,  high cervical region: Secondary | ICD-10-CM | POA: Diagnosis not present

## 2018-07-31 DIAGNOSIS — M7581 Other shoulder lesions, right shoulder: Secondary | ICD-10-CM | POA: Diagnosis not present

## 2018-07-31 DIAGNOSIS — M353 Polymyalgia rheumatica: Secondary | ICD-10-CM | POA: Diagnosis not present

## 2018-07-31 DIAGNOSIS — M158 Other polyosteoarthritis: Secondary | ICD-10-CM | POA: Diagnosis not present

## 2018-07-31 DIAGNOSIS — M170 Bilateral primary osteoarthritis of knee: Secondary | ICD-10-CM | POA: Diagnosis not present

## 2018-07-31 DIAGNOSIS — M25461 Effusion, right knee: Secondary | ICD-10-CM | POA: Diagnosis not present

## 2018-12-12 DIAGNOSIS — Z125 Encounter for screening for malignant neoplasm of prostate: Secondary | ICD-10-CM | POA: Diagnosis not present

## 2018-12-12 DIAGNOSIS — Z0000 Encounter for general adult medical examination without abnormal findings: Secondary | ICD-10-CM | POA: Diagnosis not present

## 2018-12-12 DIAGNOSIS — Z13220 Encounter for screening for lipoid disorders: Secondary | ICD-10-CM | POA: Diagnosis not present

## 2018-12-19 DIAGNOSIS — Z13220 Encounter for screening for lipoid disorders: Secondary | ICD-10-CM | POA: Diagnosis not present

## 2018-12-19 DIAGNOSIS — Z0000 Encounter for general adult medical examination without abnormal findings: Secondary | ICD-10-CM | POA: Diagnosis not present

## 2018-12-19 DIAGNOSIS — Z125 Encounter for screening for malignant neoplasm of prostate: Secondary | ICD-10-CM | POA: Diagnosis not present

## 2019-01-30 DIAGNOSIS — R972 Elevated prostate specific antigen [PSA]: Secondary | ICD-10-CM | POA: Diagnosis not present

## 2019-01-31 DIAGNOSIS — M25511 Pain in right shoulder: Secondary | ICD-10-CM | POA: Diagnosis not present

## 2019-01-31 DIAGNOSIS — M25512 Pain in left shoulder: Secondary | ICD-10-CM | POA: Diagnosis not present

## 2019-02-09 DIAGNOSIS — R3912 Poor urinary stream: Secondary | ICD-10-CM | POA: Diagnosis not present

## 2019-02-09 DIAGNOSIS — R972 Elevated prostate specific antigen [PSA]: Secondary | ICD-10-CM | POA: Diagnosis not present

## 2019-02-09 DIAGNOSIS — R338 Other retention of urine: Secondary | ICD-10-CM | POA: Diagnosis not present

## 2019-03-19 DIAGNOSIS — R3912 Poor urinary stream: Secondary | ICD-10-CM | POA: Diagnosis not present

## 2019-03-19 DIAGNOSIS — R972 Elevated prostate specific antigen [PSA]: Secondary | ICD-10-CM | POA: Diagnosis not present

## 2019-04-17 DIAGNOSIS — R350 Frequency of micturition: Secondary | ICD-10-CM | POA: Diagnosis not present

## 2019-04-17 DIAGNOSIS — M62838 Other muscle spasm: Secondary | ICD-10-CM | POA: Diagnosis not present

## 2019-04-17 DIAGNOSIS — R3912 Poor urinary stream: Secondary | ICD-10-CM | POA: Diagnosis not present

## 2019-04-17 DIAGNOSIS — R3915 Urgency of urination: Secondary | ICD-10-CM | POA: Diagnosis not present

## 2019-05-09 DIAGNOSIS — R3912 Poor urinary stream: Secondary | ICD-10-CM | POA: Diagnosis not present

## 2019-05-09 DIAGNOSIS — M62838 Other muscle spasm: Secondary | ICD-10-CM | POA: Diagnosis not present

## 2019-05-09 DIAGNOSIS — R3915 Urgency of urination: Secondary | ICD-10-CM | POA: Diagnosis not present

## 2019-05-09 DIAGNOSIS — R350 Frequency of micturition: Secondary | ICD-10-CM | POA: Diagnosis not present

## 2019-05-30 DIAGNOSIS — R3912 Poor urinary stream: Secondary | ICD-10-CM | POA: Diagnosis not present

## 2019-05-30 DIAGNOSIS — R338 Other retention of urine: Secondary | ICD-10-CM | POA: Diagnosis not present

## 2019-05-30 DIAGNOSIS — R350 Frequency of micturition: Secondary | ICD-10-CM | POA: Diagnosis not present

## 2019-05-30 DIAGNOSIS — R3915 Urgency of urination: Secondary | ICD-10-CM | POA: Diagnosis not present

## 2019-06-12 DIAGNOSIS — R972 Elevated prostate specific antigen [PSA]: Secondary | ICD-10-CM | POA: Diagnosis not present

## 2019-06-25 DIAGNOSIS — M1990 Unspecified osteoarthritis, unspecified site: Secondary | ICD-10-CM | POA: Diagnosis not present

## 2019-06-25 DIAGNOSIS — M47816 Spondylosis without myelopathy or radiculopathy, lumbar region: Secondary | ICD-10-CM | POA: Diagnosis not present

## 2019-07-10 DIAGNOSIS — R350 Frequency of micturition: Secondary | ICD-10-CM | POA: Diagnosis not present

## 2019-07-10 DIAGNOSIS — R972 Elevated prostate specific antigen [PSA]: Secondary | ICD-10-CM | POA: Diagnosis not present

## 2019-09-18 DIAGNOSIS — M25512 Pain in left shoulder: Secondary | ICD-10-CM | POA: Diagnosis not present

## 2019-09-18 DIAGNOSIS — M25511 Pain in right shoulder: Secondary | ICD-10-CM | POA: Diagnosis not present

## 2019-09-25 ENCOUNTER — Other Ambulatory Visit: Payer: BC Managed Care – PPO

## 2019-09-25 DIAGNOSIS — Z20822 Contact with and (suspected) exposure to covid-19: Secondary | ICD-10-CM

## 2019-09-26 DIAGNOSIS — Z1159 Encounter for screening for other viral diseases: Secondary | ICD-10-CM | POA: Diagnosis not present

## 2019-09-27 LAB — SARS-COV-2, NAA 2 DAY TAT

## 2019-09-27 LAB — NOVEL CORONAVIRUS, NAA: SARS-CoV-2, NAA: NOT DETECTED

## 2019-11-22 DIAGNOSIS — E782 Mixed hyperlipidemia: Secondary | ICD-10-CM | POA: Diagnosis not present

## 2019-11-22 DIAGNOSIS — R1310 Dysphagia, unspecified: Secondary | ICD-10-CM | POA: Diagnosis not present

## 2019-11-22 DIAGNOSIS — J45909 Unspecified asthma, uncomplicated: Secondary | ICD-10-CM | POA: Diagnosis not present

## 2019-11-22 DIAGNOSIS — M62838 Other muscle spasm: Secondary | ICD-10-CM | POA: Diagnosis not present

## 2019-11-22 DIAGNOSIS — E559 Vitamin D deficiency, unspecified: Secondary | ICD-10-CM | POA: Diagnosis not present

## 2019-11-22 DIAGNOSIS — J4520 Mild intermittent asthma, uncomplicated: Secondary | ICD-10-CM | POA: Diagnosis not present

## 2019-11-22 DIAGNOSIS — R5383 Other fatigue: Secondary | ICD-10-CM | POA: Diagnosis not present

## 2019-11-22 DIAGNOSIS — Z0000 Encounter for general adult medical examination without abnormal findings: Secondary | ICD-10-CM | POA: Diagnosis not present

## 2019-12-10 ENCOUNTER — Ambulatory Visit (INDEPENDENT_AMBULATORY_CARE_PROVIDER_SITE_OTHER): Payer: BC Managed Care – PPO | Admitting: Otolaryngology

## 2019-12-10 ENCOUNTER — Other Ambulatory Visit: Payer: Self-pay

## 2019-12-10 ENCOUNTER — Encounter (INDEPENDENT_AMBULATORY_CARE_PROVIDER_SITE_OTHER): Payer: Self-pay | Admitting: Otolaryngology

## 2019-12-10 VITALS — Temp 97.9°F

## 2019-12-10 DIAGNOSIS — R1314 Dysphagia, pharyngoesophageal phase: Secondary | ICD-10-CM

## 2019-12-10 NOTE — Progress Notes (Signed)
HPI: Steven Hendricks is a 54 y.o. male who presents is referred by Roslynn Amble, PA for evaluation of dysphagia.  Patient states that he has had some problems with swallowing for several years now.  He had previously seen Dr. Haroldine Laws in 2019 and had a CT scan of his neck performed which was normal.  On review of the CT scan he did have prominent lingual tonsillar tissue.  He was also noted to have a right thyroid nodule that appeared benign.  He had previous ultrasound and fine-needle aspirate of the right thyroid nodule performed 5 years ago which was benign. He states that the swallowing problems occur more with small bits of food such as rice crispies.  They sometimes go the wrong way and make him cough.  He doesn't seem to have that much trouble with large boluses of food.  He has had no weight loss.  Denies any sore throat today.  When he leans forward and swallows sometimes he feels a little bit of discomfort more on the right side.. Patient does have history of GE reflux disease and used to take omeprazole but isn't taking no antacids presently.  Past Medical History:  Diagnosis Date  . Asthma   . Diverticulosis   . Hemorrhoids   . History of hernia repair    right  . IBS (irritable bowel syndrome)   . Ulnar nerve compression    Past Surgical History:  Procedure Laterality Date  . COLONOSCOPY    . HERNIA REPAIR     Social History   Socioeconomic History  . Marital status: Married    Spouse name: Not on file  . Number of children: Not on file  . Years of education: Not on file  . Highest education level: Not on file  Occupational History  . Not on file  Tobacco Use  . Smoking status: Current Some Day Smoker    Types: Cigars  . Smokeless tobacco: Never Used  . Tobacco comment: 2 or 3 a year  Substance and Sexual Activity  . Alcohol use: Yes    Comment: weekly  . Drug use: No  . Sexual activity: Not on file  Other Topics Concern  . Not on file  Social History Narrative   . Not on file   Social Determinants of Health   Financial Resource Strain:   . Difficulty of Paying Living Expenses: Not on file  Food Insecurity:   . Worried About Programme researcher, broadcasting/film/video in the Last Year: Not on file  . Ran Out of Food in the Last Year: Not on file  Transportation Needs:   . Lack of Transportation (Medical): Not on file  . Lack of Transportation (Non-Medical): Not on file  Physical Activity:   . Days of Exercise per Week: Not on file  . Minutes of Exercise per Session: Not on file  Stress:   . Feeling of Stress : Not on file  Social Connections:   . Frequency of Communication with Friends and Family: Not on file  . Frequency of Social Gatherings with Friends and Family: Not on file  . Attends Religious Services: Not on file  . Active Member of Clubs or Organizations: Not on file  . Attends Banker Meetings: Not on file  . Marital Status: Not on file   Family History  Problem Relation Age of Onset  . Irritable bowel syndrome Sister   . Crohn's disease Sister    No Known Allergies Prior to Admission medications  Not on File     Positive ROS: Otherwise negative  All other systems have been reviewed and were otherwise negative with the exception of those mentioned in the HPI and as above.  Physical Exam: Constitutional: Alert, well-appearing, no acute distress Ears: External ears without lesions or tenderness. Ear canals are clear bilaterally with intact, clear TMs bilaterally.  Hearing screening with a tuning forks revealed symmetric hearing and good hearing in both ears. Nasal: External nose without lesions. Septum slightly deviated to the left.. Clear nasal passages otherwise. Oral: Lips and gums without lesions. Tongue and palate mucosa without lesions. Posterior oropharynx clear.  Patient is status post tonsillectomy.  Indirect laryngoscopy revealed a clear base of tongue with prominent lingual tonsillar tissue but no ulceration or masses  noted. Fiberoptic laryngoscopy was performed to the right nostril.  The nasopharynx was clear.  The base of tongue vallecula and epiglottis were clear.  Vocal cords were clear bilaterally with normal vocal cord mobility.  Piriform sinuses were clear bilaterally.  Fiberoptic laryngoscope was passed through the upper esophageal sphincter without difficulty and the upper cervical esophagus was clear. Neck: No palpable adenopathy or masses.  Do not appreciate any significant palpable adenopathy in the supraclavicular area or on palpation of the right thyroid lobe. Respiratory: Breathing comfortably  Skin: No facial/neck lesions or rash noted.  Laryngoscopy  Date/Time: 12/10/2019 5:47 PM Performed by: Drema Halon, MD Authorized by: Drema Halon, MD   Consent:    Consent obtained:  Verbal   Consent given by:  Patient Procedure details:    Indications: direct visualization of the upper aerodigestive tract and hoarseness, dysphagia, or aspiration     Medication:  Afrin   Instrument: flexible fiberoptic laryngoscope     Scope location: right nare   Sinus:    Right nasopharynx: normal   Mouth:    Oropharynx: normal     Vallecula: normal     Base of tongue: normal     Epiglottis: normal   Throat:    Pyriform sinus: normal     True vocal cords: normal   Comments:     On fiberoptic laryngoscopy the hypopharynx and larynx were normal to examination.  I was able to pass the fiberoptic laryngoscope through the upper esophageal sphincter without difficulty and the upper cervical esophagus was clear.    Assessment: Dysphagia with normal upper airway examination.  Questionable etiology of this. Benign right thyroid nodule History of GE reflux disease may be contributing some to this.  Plan: Reviewed with patient concerning normal examination. If his swallowing problems become worse I would recommend further evaluation with gastroenterology or possibly scheduling a barium  swallow or modified barium swallow.  But on clinical exam and review of previous CT scan no obvious abnormalities noted except for a benign right thyroid nodule.   Narda Bonds, MD   CC:

## 2019-12-14 ENCOUNTER — Ambulatory Visit (INDEPENDENT_AMBULATORY_CARE_PROVIDER_SITE_OTHER): Payer: BC Managed Care – PPO

## 2019-12-14 ENCOUNTER — Encounter: Payer: Self-pay | Admitting: Pulmonary Disease

## 2019-12-14 ENCOUNTER — Other Ambulatory Visit: Payer: Self-pay

## 2019-12-14 ENCOUNTER — Ambulatory Visit (INDEPENDENT_AMBULATORY_CARE_PROVIDER_SITE_OTHER): Payer: BC Managed Care – PPO | Admitting: Pulmonary Disease

## 2019-12-14 VITALS — BP 132/90 | HR 65 | Temp 97.3°F | Ht 72.0 in | Wt 184.2 lb

## 2019-12-14 DIAGNOSIS — R0602 Shortness of breath: Secondary | ICD-10-CM

## 2019-12-14 NOTE — Progress Notes (Signed)
Steven Hendricks    [id number]    1965-10-29  Primary Care Physician:Reade, Molly Maduro, MD  Referring Physician: Adrienne Mocha, PA [address]  [place],  [place] [address]  Chief complaint:   Patient being seen for shortness of breath  HPI:  Shortness of breath with exertion Used to be able to run miles on currently about a couple of miles will make him short of breath  Wheezes on a regular basis Cough with sputum production  Past history of asthma as a child Other family members with asthma  No significant occupational predisposition to lung disease  Has been getting concerned about his breathing for the last 2 to 3 years  He does have sinus congestion, possibly allergies-unaware of what he is actually allergic to Has 2 dogs-breathing does not get any worse around his pets  Stop running actively a couple years ago secondary to knee and back issues  He has a cigar here and there may be 3-4 a year at most Never really smoked cigarettes  Outpatient Encounter Medications as of 12/14/2019  Medication Sig  . albuterol (VENTOLIN HFA) 108 (90 Base) MCG/ACT inhaler Inhale 2 puffs into the lungs every 4 (four) hours as needed.  . meloxicam (MOBIC) 15 MG tablet Take 15 mg by mouth daily.  . Multiple Vitamin (MULTI-VITAMIN) tablet Take 1 tablet by mouth daily.   No facility-administered encounter medications on file as of 12/14/2019.    Allergies as of 12/14/2019  . (No Known Allergies)    Past Medical History:  Diagnosis Date  . Asthma   . Diverticulosis   . Hemorrhoids   . History of hernia repair    right  . IBS (irritable bowel syndrome)   . Ulnar nerve compression     Past Surgical History:  Procedure Laterality Date  . COLONOSCOPY    . HERNIA REPAIR      Family History  Problem Relation Age of Onset  . Irritable bowel syndrome Sister   . Crohn's disease Sister     Social History   Socioeconomic History  . Marital status: Married     Spouse name: Not on file  . Number of children: Not on file  . Years of education: Not on file  . Highest education level: Not on file  Occupational History  . Not on file  Tobacco Use  . Smoking status: Current Some Day Smoker    Types: Cigars  . Smokeless tobacco: Never Used  . Tobacco comment: 2 or 3 a year  Substance and Sexual Activity  . Alcohol use: Yes    Comment: weekly  . Drug use: No  . Sexual activity: Not on file  Other Topics Concern  . Not on file  Social History Narrative  . Not on file   Social Determinants of Health   Financial Resource Strain: Not on file  Food Insecurity: Not on file  Transportation Needs: Not on file  Physical Activity: Not on file  Stress: Not on file  Social Connections: Not on file  Intimate Partner Violence: Not on file    Review of Systems  Respiratory: Positive for cough, shortness of breath and wheezing.     Vitals:   12/14/19 1135  BP: 132/90  Pulse: 65  Temp: (!) 97.3 F (36.3 C)  SpO2: 99%     Physical Exam Constitutional:      Appearance: Normal appearance.  HENT:     Head: Normocephalic.  Mouth/Throat:     Pharynx: No oropharyngeal exudate or posterior oropharyngeal erythema.  Eyes:     General:        Right eye: No discharge.        Left eye: No discharge.  Cardiovascular:     Rate and Rhythm: Normal rate and regular rhythm.     Heart sounds: No murmur heard. No friction rub.  Pulmonary:     Effort: No respiratory distress.     Breath sounds: No stridor. No wheezing or rhonchi.  Musculoskeletal:     Cervical back: No rigidity or tenderness.  Neurological:     Mental Status: He is alert.  Psychiatric:        Mood and Affect: Mood normal.    Assessment:  Shortness of breath  Wheezing  Cough  Musculoskeletal pain and discomfort lower extremity and back does cause some limitations as well  Allergies with symptoms of nasal stuffiness and congestion may be contributing to symptoms and  possibly contributing to wheezing as well  Plan/Recommendations: Obtain allergy panel  Obtain chest x-ray  Obtain pulmonary function test  Tentative follow-up in 4 to 6 weeks  He does have albuterol at home, encouraged to use albuterol as needed up to 4 times a day for shortness of breath and wheezing   [name] MD [facility] 12/14/2019, 11:48 AM  CC: Adrienne Mocha, PA

## 2019-12-14 NOTE — Patient Instructions (Signed)
Shortness of breath, wheezing  -May be related to allergies -Recurrence of asthma symptoms  We will obtain a chest x-ray Breathing study Allergy panel  Follow-up in 4 to 6 weeks   Use albuterol as needed, up to 4 times a day as needed  Breathing study should be able to give [place] some guidance with respect to underlying lung disease

## 2019-12-18 LAB — ALLERGEN PANEL (27) + IGE
Alternaria Alternata IgE: <0.1 kU/L
Aspergillus Fumigatus IgE: <0.1 kU/L
Bahia Grass IgE: <0.1 kU/L
Bermuda Grass IgE: <0.1 kU/L
Cat Dander IgE: <0.1 kU/L
Cedar, Mountain IgE: <0.1 kU/L
Cladosporium Herbarum IgE: <0.1 kU/L
Cocklebur IgE: <0.1 kU/L
Cockroach, American IgE: 0.22 kU/L — AB
Common Silver Birch IgE: <0.1 kU/L
D Farinae IgE: 8.79 kU/L — AB
D Pteronyssinus IgE: 11 kU/L — AB
Dog Dander IgE: <0.1 kU/L
Elm, American IgE: <0.1 kU/L
Hickory, White IgE: <0.1 kU/L
IgE (Immunoglobulin E), Serum: 85 IU/mL (ref 6–495)
Johnson Grass IgE: <0.1 kU/L
Kentucky Bluegrass IgE: <0.1 kU/L
Maple/Box Elder IgE: <0.1 kU/L
Mucor Racemosus IgE: <0.1 kU/L
Oak, White IgE: <0.1 kU/L
Penicillium Chrysogen IgE: <0.1 kU/L
Pigweed, Rough IgE: <0.1 kU/L
Plantain, English IgE: <0.1 kU/L
Ragweed, Short IgE: <0.1 kU/L
Setomelanomma Rostrat: <0.1 kU/L
Timothy Grass IgE: <0.1 kU/L
White Mulberry IgE: <0.1 kU/L

## 2020-01-11 DIAGNOSIS — Z20828 Contact with and (suspected) exposure to other viral communicable diseases: Secondary | ICD-10-CM | POA: Diagnosis not present

## 2020-02-21 DIAGNOSIS — U071 COVID-19: Secondary | ICD-10-CM | POA: Diagnosis not present

## 2020-02-21 DIAGNOSIS — R6883 Chills (without fever): Secondary | ICD-10-CM | POA: Diagnosis not present

## 2020-02-21 DIAGNOSIS — R5383 Other fatigue: Secondary | ICD-10-CM | POA: Diagnosis not present

## 2020-02-21 DIAGNOSIS — R059 Cough, unspecified: Secondary | ICD-10-CM | POA: Diagnosis not present

## 2020-02-22 ENCOUNTER — Other Ambulatory Visit: Payer: Self-pay

## 2020-02-22 ENCOUNTER — Emergency Department (HOSPITAL_COMMUNITY)
Admission: EM | Admit: 2020-02-22 | Discharge: 2020-02-22 | Disposition: A | Discharge status: Home / Self Care | Payer: BC Managed Care – PPO | Attending: Emergency Medicine | Admitting: Emergency Medicine

## 2020-02-22 ENCOUNTER — Encounter (HOSPITAL_COMMUNITY): Payer: Self-pay | Admitting: Emergency Medicine

## 2020-02-22 ENCOUNTER — Ambulatory Visit (HOSPITAL_COMMUNITY)
Admission: EM | Admit: 2020-02-22 | Discharge: 2020-02-22 | Disposition: A | Discharge status: Still In Hospital | Payer: BC Managed Care – PPO

## 2020-02-22 DIAGNOSIS — E860 Dehydration: Secondary | ICD-10-CM | POA: Diagnosis not present

## 2020-02-22 DIAGNOSIS — Z79899 Other long term (current) drug therapy: Secondary | ICD-10-CM | POA: Diagnosis not present

## 2020-02-22 DIAGNOSIS — U071 COVID-19: Secondary | ICD-10-CM | POA: Diagnosis not present

## 2020-02-22 DIAGNOSIS — F17290 Nicotine dependence, other tobacco product, uncomplicated: Secondary | ICD-10-CM | POA: Diagnosis not present

## 2020-02-22 DIAGNOSIS — R5383 Other fatigue: Secondary | ICD-10-CM | POA: Diagnosis not present

## 2020-02-22 DIAGNOSIS — J45909 Unspecified asthma, uncomplicated: Secondary | ICD-10-CM | POA: Insufficient documentation

## 2020-02-22 DIAGNOSIS — A0839 Other viral enteritis: Secondary | ICD-10-CM | POA: Diagnosis not present

## 2020-02-22 LAB — URINALYSIS, ROUTINE W REFLEX MICROSCOPIC
Bilirubin Urine: NEGATIVE
Glucose, UA: NEGATIVE mg/dL
Hgb urine dipstick: NEGATIVE
Ketones, ur: NEGATIVE mg/dL
Leukocytes,Ua: NEGATIVE
Nitrite: NEGATIVE
Protein, ur: 30 mg/dL — AB
Specific Gravity, Urine: 1.028 (ref 1.005–1.030)
pH: 5 (ref 5.0–8.0)

## 2020-02-22 LAB — COMPREHENSIVE METABOLIC PANEL
ALT: 20 U/L (ref 0–44)
AST: 21 U/L (ref 15–41)
Albumin: 3.7 g/dL (ref 3.5–5.0)
Alkaline Phosphatase: 54 U/L (ref 38–126)
Anion gap: 13 (ref 5–15)
BUN: 21 mg/dL — ABNORMAL HIGH (ref 6–20)
CO2: 23 mmol/L (ref 22–32)
Calcium: 9.3 mg/dL (ref 8.9–10.3)
Chloride: 99 mmol/L (ref 98–111)
Creatinine, Ser: 1.28 mg/dL — ABNORMAL HIGH (ref 0.61–1.24)
GFR, Estimated: >60 mL/min (ref >60)
Glucose, Bld: 116 mg/dL — ABNORMAL HIGH (ref 70–99)
Potassium: 3.1 mmol/L — ABNORMAL LOW (ref 3.5–5.1)
Sodium: 135 mmol/L (ref 135–145)
Total Bilirubin: 0.6 mg/dL (ref 0.3–1.2)
Total Protein: 7.2 g/dL (ref 6.5–8.1)

## 2020-02-22 LAB — CBC WITH DIFFERENTIAL/PLATELET
Abs Immature Granulocytes: 0 10*3/uL (ref 0.00–0.07)
Basophils Absolute: 0.1 10*3/uL (ref 0.0–0.1)
Basophils Relative: 1 %
Eosinophils Absolute: 0.1 10*3/uL (ref 0.0–0.5)
Eosinophils Relative: 1 %
HCT: 48.2 % (ref 39.0–52.0)
Hemoglobin: 16.8 g/dL (ref 13.0–17.0)
Lymphocytes Relative: 38 %
Lymphs Abs: 2.7 10*3/uL (ref 0.7–4.0)
MCH: 31.3 pg (ref 26.0–34.0)
MCHC: 34.9 g/dL (ref 30.0–36.0)
MCV: 89.8 fL (ref 80.0–100.0)
Monocytes Absolute: 1.6 10*3/uL — ABNORMAL HIGH (ref 0.1–1.0)
Monocytes Relative: 23 %
Neutro Abs: 2.6 10*3/uL (ref 1.7–7.7)
Neutrophils Relative %: 37 %
Platelets: 242 10*3/uL (ref 150–400)
RBC: 5.37 MIL/uL (ref 4.22–5.81)
RDW: 12.7 % (ref 11.5–15.5)
WBC: 7 10*3/uL (ref 4.0–10.5)
nRBC: 0 % (ref 0.0–0.2)
nRBC: 0 /100 WBC

## 2020-02-22 MED ORDER — POTASSIUM CHLORIDE CRYS ER 20 MEQ PO TBCR
40.0000 meq | EXTENDED_RELEASE_TABLET | Freq: Once | ORAL | Status: AC
  Administered 2020-02-22: 40 meq via ORAL
  Filled 2020-02-22: qty 2

## 2020-02-22 MED ORDER — SODIUM CHLORIDE 0.9 % IV BOLUS
1000.0000 mL | Freq: Once | INTRAVENOUS | Status: AC
  Administered 2020-02-22: 1000 mL via INTRAVENOUS

## 2020-02-22 NOTE — ED Triage Notes (Signed)
Pt reports having covid for 1 week he has been having extreme fatigue and diarrhea his pcp told him to come to ER for IV hydration. Pt states he feels better right now than he has all week he just was able to urinate for the first time in 24 hours.

## 2020-02-22 NOTE — ED Notes (Signed)
Pt's walking oximetry was @ 97% with no complaints of SHOB, dizziness, nor fatigue.

## 2020-02-22 NOTE — ED Provider Notes (Signed)
[facility] EMERGENCY DEPARTMENT Provider Note   CSN: [id number] Arrival date & time: 02/22/20  1512     History Chief Complaint  Patient presents with  . Covid Positive  . Diarrhea    Steven Hendricks is a 55 y.o. male.  The history is provided by the patient. No language interpreter was used.  Diarrhea    55 year old male significant history of asthma, diverticular disease, IBS, recently diagnosed with COVID infection a week ago.  Symptoms including generalized fatigue, myalgias, joint pain, nausea, persistent diarrhea.  He endorsed occasional cough but no significant shortness of breath.  Denies any significant fever.  He just has no appetite.  He has been vaccinated for COVID-19 but has not had his booster shot.  His wife was sick with COVID just prior to his infection.  He reach out to his primary care provider today with his symptoms.  PCP felt he should come to the ER for IV fluid due to concerns of dehydration.  Patient is a social drinker, and occasional marijuana user.  He does not complain of any significant shortness of breath at this time.  He did report feeling a little bit better today than what he has been feeling.  Past Medical History:  Diagnosis Date  . Asthma   . Diverticulosis   . Hemorrhoids   . History of hernia repair    right  . IBS (irritable bowel syndrome)   . Ulnar nerve compression     There are no problems to display for this patient.   Past Surgical History:  Procedure Laterality Date  . COLONOSCOPY    . HERNIA REPAIR         Family History  Problem Relation Age of Onset  . Irritable bowel syndrome Sister   . Crohn's disease Sister     Social History   Tobacco Use  . Smoking status: Current Some Day Smoker    Types: Cigars  . Smokeless tobacco: Never Used  . Tobacco comment: 2 or 3 a year  Substance Use Topics  . Alcohol use: Yes    Comment: weekly  . Drug use: No    Home Medications Prior to Admission  medications   Medication Sig Start Date End Date Taking? Authorizing Provider  albuterol (VENTOLIN HFA) 108 (90 Base) MCG/ACT inhaler Inhale 2 puffs into the lungs every 4 (four) hours as needed. 09/17/19   [provider]  meloxicam (MOBIC) 15 MG tablet Take 15 mg by mouth daily. 09/06/19   [provider]  Multiple Vitamin (MULTI-VITAMIN) tablet Take 1 tablet by mouth daily.    [provider]    Allergies    Patient has no known allergies.  Review of Systems   Review of Systems  Gastrointestinal: Positive for diarrhea.  All other systems reviewed and are negative.   Physical Exam Updated Vital Signs BP (!) 146/104 (BP Location: Right Arm)   Pulse 89   Temp 97.6 F (36.4 C) (Oral)   Resp 20   SpO2 99%   Physical Exam Vitals and nursing note reviewed.  Constitutional:      General: He is not in acute distress.    Appearance: He is well-developed and well-nourished.  HENT:     Head: Atraumatic.  Eyes:     Conjunctiva/sclera: Conjunctivae normal.  Cardiovascular:     Rate and Rhythm: Normal rate and regular rhythm.     Pulses: Normal pulses.     Heart sounds: Normal heart sounds.  Pulmonary:     Effort: Pulmonary effort is normal.     Breath sounds: Normal breath sounds.  Abdominal:     General: Abdomen is flat.     Palpations: Abdomen is soft.     Tenderness: There is no abdominal tenderness.  Musculoskeletal:        General: Normal range of motion.     Cervical back: Neck supple.  Skin:    Findings: No rash.  Neurological:     Mental Status: He is alert and oriented to person, place, and time.  Psychiatric:        Mood and Affect: Mood and affect and mood normal.     ED Results / Procedures / Treatments   Labs (all labs ordered are listed, but only abnormal results are displayed) Labs Reviewed  COMPREHENSIVE METABOLIC PANEL - Abnormal; Notable for the following components:      Result Value   Potassium 3.1 (*)    Glucose, Bld  116 (*)    BUN 21 (*)    Creatinine, Ser 1.28 (*)    All other components within normal limits  CBC WITH DIFFERENTIAL/PLATELET - Abnormal; Notable for the following components:   Monocytes Absolute 1.6 (*)    All other components within normal limits  URINALYSIS, ROUTINE W REFLEX MICROSCOPIC - Abnormal; Notable for the following components:   Color, Urine AMBER (*)    APPearance HAZY (*)    Protein, ur 30 (*)    Bacteria, UA RARE (*)    All other components within normal limits    EKG None  Radiology No results found.  Procedures Procedures   Medications Ordered in ED Medications  sodium chloride 0.9 % bolus 1,000 mL (0 mLs Intravenous Stopped 02/22/20 2027)  potassium chloride SA (KLOR-CON) CR tablet 40 mEq (40 mEq Oral Given 02/22/20 1906)    ED Course  I have reviewed the triage vital signs and the nursing notes.  Pertinent labs & imaging results that were available during my care of the patient were reviewed by me and considered in my medical decision making (see chart for details).    MDM Rules/Calculators/A&P                          BP (!) 146/104 (BP Location: Right Arm)   Pulse 89   Temp 97.6 F (36.4 C) (Oral)   Resp 20   SpO2 99%   Final Clinical Impression(s) / ED Diagnoses Final diagnoses:  COVID-19 virus infection  Dehydration    Rx / DC Orders ED Discharge Orders         Ordered    Ambulatory referral for Covid Treatment        02/22/20 1819         5:43 PM Patient with COVID symptoms for the past 5 days here with complaints of feeling dehydrated due to decrease in appetite.  His labs remarkable for mild AKI with BUN 21, creatinine 1.28.  Mild hypokalemia with a potassium of 3.1.  He is afebrile, no hypoxia.  Abdominal exam unremarkable.  Will give IV fluid, will give potassium supplementation.  Since pt is still within the window of treatment with monoclonal antibody, will give ambulatory referral to our infusion clinic.     Fayrene Helper,  PA-C 02/23/20 1952    Maia Plan, MD 02/24/20 [phone]

## 2020-02-22 NOTE — Discharge Instructions (Addendum)
You have been giving IV fluids for signs of dehydration due to recent Covid infection.  Follow instruction below.  Recommendations for at home COVID-19 symptoms management:  Please continue isolation at home. Call [phone] to see whether you might be eligible for therapeutic antibody infusions (leave your name and they will call you back).  If have acute worsening of symptoms please go to ER/urgent care for further evaluation. Check pulse oximetry and if below 90-92% please go to ER. The following supplements MAY help:  Vitamin C 500mg  twice a day and Quercetin 250-500 mg twice a day Vitamin D3 2000 - 4000 u/day B Complex vitamins Zinc 75-100 mg/day Melatonin 6-10 mg at night (the optimal dose is unknown) Aspirin 81mg /day (if no history of bleeding issues)

## 2020-02-22 NOTE — ED Notes (Signed)
Reviewed discharge instructions with patient. Follow-up care reviewed. Patient verbalized understanding. Patient A&Ox4, VSS, and ambulatory with steady gait upon discharge.  

## 2020-02-22 NOTE — ED Provider Notes (Signed)
I assumed care of patient at shift change from previous team, please see their note for full H&P. Briefly at this point plan is to follow-up on IV fluids after they are completed.    Patient was able to ambulate with oxygen remaining 97% without complaints.  Patient tells me "I am really ready to get out of here."  No indication for admission at this time, patient will be discharged home.  Steven Hendricks was evaluated in Emergency Department on 02/22/2020 for the symptoms described in the history of present illness. He was evaluated in the context of the global COVID-19 pandemic, which necessitated consideration that the patient might be at risk for infection with the SARS-CoV-2 virus that causes COVID-19. Institutional protocols and algorithms that pertain to the evaluation of patients at risk for COVID-19 are in a state of rapid change based on information released by regulatory bodies including the CDC and federal and state organizations. These policies and algorithms were followed during the patient's care in the ED.  Return precautions were discussed with patient who states their understanding.  At the time of discharge patient denied any unaddressed complaints or concerns.  Patient is agreeable for discharge home.  Note: Portions of this report may have been transcribed using voice recognition software. Every effort was made to ensure accuracy; however, inadvertent computerized transcription errors may be present    Steven Hendricks 02/22/20 2020    Charlynne Pander, MD 02/22/20 [phone]

## 2020-04-11 DIAGNOSIS — R5383 Other fatigue: Secondary | ICD-10-CM | POA: Diagnosis not present

## 2020-04-11 DIAGNOSIS — R0602 Shortness of breath: Secondary | ICD-10-CM | POA: Diagnosis not present

## 2020-05-26 DIAGNOSIS — E782 Mixed hyperlipidemia: Secondary | ICD-10-CM | POA: Diagnosis not present

## 2020-05-26 DIAGNOSIS — T753XXA Motion sickness, initial encounter: Secondary | ICD-10-CM | POA: Diagnosis not present

## 2020-05-26 DIAGNOSIS — J4520 Mild intermittent asthma, uncomplicated: Secondary | ICD-10-CM | POA: Diagnosis not present

## 2020-05-26 DIAGNOSIS — Z91038 Other insect allergy status: Secondary | ICD-10-CM | POA: Diagnosis not present

## 2020-06-05 DIAGNOSIS — M2550 Pain in unspecified joint: Secondary | ICD-10-CM | POA: Diagnosis not present

## 2020-07-03 NOTE — Progress Notes (Signed)
Cardiology Office Note:    Date:  07/04/2020   ID:  Hendricks BON, DOB 12-09-1965, MRN [id number]  PCP:  Elias Else, MD  Cardiologist:  None   Referring MD: Wilfrid Lund, [place]   Chief Complaint  Patient presents with   Follow-up     History of Present Illness:    Steven Hendricks is a 55 y.o. male with a hx of exertional dyspnea and weakness   In February 2022 the patient had COVID.  He was quite ill.  Had respiratory difficulty.  Became dehydrated and required IV fluids.  In early April after he apparently recovered from COVID 19, a very hard workout on the elliptical led to near syncope, fatigue, elevated heart rate that persisted, and he was required to lie down.  After an hour or so he gradually improved.  This has not recurred.  He was seen by primary care after this episode and this cardiology appointment was set up 2 months ago.  He has had no subsequent difficulty.  He is back to physical activity and feels relatively normal.  There was no chest discomfort, orthopnea, PND, or chest pain.  Since COVID he has noted occasional episodes of heart rate increasing without exertion.  Past Medical History:  Diagnosis Date   Asthma    Diverticulosis    Hemorrhoids    History of hernia repair    right   IBS (irritable bowel syndrome)    Ulnar nerve compression     Past Surgical History:  Procedure Laterality Date   COLONOSCOPY     HERNIA REPAIR      Current Medications: Current Meds  Medication Sig   albuterol (VENTOLIN HFA) 108 (90 Base) MCG/ACT inhaler Inhale 2 puffs into the lungs every 4 (four) hours as needed.   Multiple Vitamin (MULTI-VITAMIN) tablet Take 1 tablet by mouth daily.     Allergies:   Patient has no known allergies.   Social History   Socioeconomic History   Marital status: Married    Spouse name: Not on file   Number of children: Not on file   Years of education: Not on file   Highest education level: Not on file  Occupational History    Not on file  Tobacco Use   Smoking status: Some Days    Pack years: 0.00    Types: Cigars   Smokeless tobacco: Never   Tobacco comments:    2 or 3 a year  Substance and Sexual Activity   Alcohol use: Yes    Comment: weekly   Drug use: No   Sexual activity: Not on file  Other Topics Concern   Not on file  Social History Narrative   Not on file   Social Determinants of Health   Financial Resource Strain: Not on file  Food Insecurity: Not on file  Transportation Needs: Not on file  Physical Activity: Not on file  Stress: Not on file  Social Connections: Not on file     Family History: The patient's family history includes Crohn's disease in his sister; Irritable bowel syndrome in his sister.  ROS:   Please see the history of present illness.    Has been on statins in the past.  He attributes developing polymyalgia rheumatica to be on statin therapy.  He is physically active.  He feels well.  Sleeps well.  No cognitive impairment after COVID.  Has history of asthma.  Occasionally wheezes.  All other systems reviewed and are negative.  EKGs/Labs/Other Studies Reviewed:    The following studies were reviewed today: No cardiac work-up.  EKG:  EKG normal sinus rhythm, leftward axis.  Overall normal in appearance.  Recent Labs: 02/22/2020: ALT 20; BUN 21; Creatinine, Ser 1.28; Hemoglobin 16.8; Platelets 242; Potassium 3.1; Sodium 135  Recent Lipid Panel No results found for: CHOL, TRIG, HDL, CHOLHDL, VLDL, LDLCALC, LDLDIRECT  Physical Exam:    VS:  BP 124/80   Pulse 72   Ht 6' (1.829 m)   Wt 184 lb (83.5 kg)   SpO2 96%   BMI 24.95 kg/m     Wt Readings from Last 3 Encounters:  07/04/20 184 lb (83.5 kg)  12/14/19 184 lb 4 oz (83.6 kg)     GEN: Healthy appearing. No acute distress HEENT: Normal NECK: No JVD. LYMPHATICS: No lymphadenopathy CARDIAC: No murmur. RRR no gallop, or edema. VASCULAR:  Normal Pulses. No bruits. RESPIRATORY:  Clear to auscultation  without rales, wheezing or rhonchi  ABDOMEN: Soft, non-tender, non-distended, No pulsatile mass, MUSCULOSKELETAL: No deformity  SKIN: Warm and dry NEUROLOGIC:  Alert and oriented x 3 PSYCHIATRIC:  Normal affect   ASSESSMENT:    1. Heart rate fast   2. SOB (shortness of breath)   3. Exercise-induced weakness    PLAN:    In order of problems listed above:  This constellation of symptoms that included shortness of breath fatigue and weakness and higher than normal heart rate following a heavy exercise routine suggests possibility of dysautonomia potentially precipitated by COVID-19 infection within the prior 4 to 6 weeks before the exercise challenge.  It is not really occurred since that time.  He continues to feel well.  We have decided watchful waiting is the most appropriate exercise at this time.   As needed follow-up if he continues to have palpitations.  I do not believe performing an echocardiogram other functional testing would be helpful at this time.   Medication Adjustments/Labs and Tests Ordered: Current medicines are reviewed at length with the patient today.  Concerns regarding medicines are outlined above.  Orders Placed This Encounter  Procedures   EKG 12-Lead    No orders of the defined types were placed in this encounter.   Patient Instructions  Medication Instructions:  Your physician recommends that you continue on your current medications as directed. Please refer to the Current Medication list given to you today.  *If you need a refill on your cardiac medications before your next appointment, please call your pharmacy*   Lab Work: None If you have labs (blood work) drawn today and your tests are completely normal, you will receive your results only by: MyChart Message (if you have MyChart) OR A paper copy in the mail If you have any lab test that is abnormal or we need to change your treatment, we will call you to review the  results.   Testing/Procedures: None   Follow-Up: At [facility], you and your health needs are our priority.  As part of our continuing mission to provide you with exceptional heart care, we have created designated Provider Care Teams.  These Care Teams include your primary Cardiologist (physician) and Advanced Practice Providers (APPs -  Physician Assistants and Nurse Practitioners) who all work together to provide you with the care you need, when you need it.  We recommend signing up for the patient portal called "MyChart".  Sign up information is provided on this After Visit Summary.  MyChart is used to connect with patients for Virtual Visits (  Telemedicine).  Patients are able to view lab/test results, encounter notes, upcoming appointments, etc.  Non-urgent messages can be sent to your provider as well.   To learn more about what you can do with MyChart, go to [email or web address].    Your next appointment:   As needed  The format for your next appointment:   In Person  Provider:   You may see Verdis Prime, MD or one of the following Advanced Practice Providers on your designated Care Team:   Georgie Chard, NP   Other Instructions     Signed, Lesleigh Noe, MD  07/04/2020 11:00 AM    [facility]

## 2020-07-04 ENCOUNTER — Encounter: Payer: Self-pay | Admitting: Interventional Cardiology

## 2020-07-04 ENCOUNTER — Ambulatory Visit (INDEPENDENT_AMBULATORY_CARE_PROVIDER_SITE_OTHER): Payer: BC Managed Care – PPO | Admitting: Interventional Cardiology

## 2020-07-04 ENCOUNTER — Other Ambulatory Visit: Payer: Self-pay

## 2020-07-04 VITALS — BP 124/80 | HR 72 | Ht 72.0 in | Wt 184.0 lb

## 2020-07-04 DIAGNOSIS — R0602 Shortness of breath: Secondary | ICD-10-CM

## 2020-07-04 DIAGNOSIS — M6281 Muscle weakness (generalized): Secondary | ICD-10-CM

## 2020-07-04 DIAGNOSIS — R000 Tachycardia, unspecified: Secondary | ICD-10-CM | POA: Diagnosis not present

## 2020-07-04 NOTE — Patient Instructions (Signed)
Medication Instructions:  Your physician recommends that you continue on your current medications as directed. Please refer to the Current Medication list given to you today.  *If you need a refill on your cardiac medications before your next appointment, please call your pharmacy*   Lab Work: None If you have labs (blood work) drawn today and your tests are completely normal, you will receive your results only by: MyChart Message (if you have MyChart) OR A paper copy in the mail If you have any lab test that is abnormal or we need to change your treatment, we will call you to review the results.   Testing/Procedures: None   Follow-Up: At [facility], you and your health needs are our priority.  As part of our continuing mission to provide you with exceptional heart care, we have created designated Provider Care Teams.  These Care Teams include your primary Cardiologist (physician) and Advanced Practice Providers (APPs -  Physician Assistants and Nurse Practitioners) who all work together to provide you with the care you need, when you need it.  We recommend signing up for the patient portal called "MyChart".  Sign up information is provided on this After Visit Summary.  MyChart is used to connect with patients for Virtual Visits (Telemedicine).  Patients are able to view lab/test results, encounter notes, upcoming appointments, etc.  Non-urgent messages can be sent to your provider as well.   To learn more about what you can do with MyChart, go to [email or web address].    Your next appointment:   As needed  The format for your next appointment:   In Person  Provider:   You may see Henry Smith, MD or one of the following Advanced Practice Providers on your designated Care Team:   Jill McDaniel, NP   Other Instructions   

## 2020-09-01 ENCOUNTER — Ambulatory Visit: Payer: BC Managed Care – PPO | Attending: Internal Medicine

## 2020-09-01 ENCOUNTER — Other Ambulatory Visit: Payer: Self-pay

## 2020-09-01 ENCOUNTER — Other Ambulatory Visit (HOSPITAL_BASED_OUTPATIENT_CLINIC_OR_DEPARTMENT_OTHER): Payer: Self-pay

## 2020-09-01 DIAGNOSIS — Z23 Encounter for immunization: Secondary | ICD-10-CM

## 2020-09-01 MED ORDER — COVID-19 AD26 VACCINE(JANSSEN) 0.5 ML IM SUSP
INTRAMUSCULAR | 0 refills | Status: DC
  Filled 2020-09-01: qty 0.5, 1d supply, fill #0

## 2020-09-01 NOTE — Progress Notes (Signed)
   Covid-19 Vaccination Clinic  Name:  CONSTANTINOS KREMPASKY    MRN: [id number] DOB: Jun 01, 1965  09/01/2020  Mr. Pokorski was observed post Covid-19 immunization for 15 minutes without incident. He was provided with Vaccine Information Sheet and instruction to access the V-Safe system.   Mr. Wickham was instructed to call 911 with any severe reactions post vaccine: Difficulty breathing  Swelling of face and throat  A fast heartbeat  A bad rash all over body  Dizziness and weakness   Immunizations Administered     Name Date Dose VIS Date Route   JANSSEN COVID-19 VACCINE 09/01/2020  9:59 AM 0.5 mL 10/24/2019 Intramuscular   Manufacturer: [employer]   Lot: [id number]   NDC: [phone]

## 2020-11-25 DIAGNOSIS — Z0000 Encounter for general adult medical examination without abnormal findings: Secondary | ICD-10-CM | POA: Diagnosis not present

## 2020-11-25 DIAGNOSIS — Z23 Encounter for immunization: Secondary | ICD-10-CM | POA: Diagnosis not present

## 2020-11-25 DIAGNOSIS — E782 Mixed hyperlipidemia: Secondary | ICD-10-CM | POA: Diagnosis not present

## 2020-11-25 DIAGNOSIS — N400 Enlarged prostate without lower urinary tract symptoms: Secondary | ICD-10-CM | POA: Diagnosis not present

## 2020-11-25 DIAGNOSIS — Z125 Encounter for screening for malignant neoplasm of prostate: Secondary | ICD-10-CM | POA: Diagnosis not present

## 2020-11-25 DIAGNOSIS — R5382 Chronic fatigue, unspecified: Secondary | ICD-10-CM | POA: Diagnosis not present

## 2020-12-02 DIAGNOSIS — R3915 Urgency of urination: Secondary | ICD-10-CM | POA: Diagnosis not present

## 2020-12-02 DIAGNOSIS — R3912 Poor urinary stream: Secondary | ICD-10-CM | POA: Diagnosis not present

## 2020-12-02 DIAGNOSIS — R972 Elevated prostate specific antigen [PSA]: Secondary | ICD-10-CM | POA: Diagnosis not present

## 2020-12-02 DIAGNOSIS — R350 Frequency of micturition: Secondary | ICD-10-CM | POA: Diagnosis not present

## 2021-01-29 DIAGNOSIS — M62830 Muscle spasm of back: Secondary | ICD-10-CM | POA: Diagnosis not present

## 2021-01-29 DIAGNOSIS — M9902 Segmental and somatic dysfunction of thoracic region: Secondary | ICD-10-CM | POA: Diagnosis not present

## 2021-01-29 DIAGNOSIS — M542 Cervicalgia: Secondary | ICD-10-CM | POA: Diagnosis not present

## 2021-01-29 DIAGNOSIS — M9901 Segmental and somatic dysfunction of cervical region: Secondary | ICD-10-CM | POA: Diagnosis not present

## 2021-01-30 DIAGNOSIS — M542 Cervicalgia: Secondary | ICD-10-CM | POA: Diagnosis not present

## 2021-01-30 DIAGNOSIS — M62830 Muscle spasm of back: Secondary | ICD-10-CM | POA: Diagnosis not present

## 2021-01-30 DIAGNOSIS — M9901 Segmental and somatic dysfunction of cervical region: Secondary | ICD-10-CM | POA: Diagnosis not present

## 2021-01-30 DIAGNOSIS — M9902 Segmental and somatic dysfunction of thoracic region: Secondary | ICD-10-CM | POA: Diagnosis not present

## 2021-02-03 DIAGNOSIS — M9901 Segmental and somatic dysfunction of cervical region: Secondary | ICD-10-CM | POA: Diagnosis not present

## 2021-02-03 DIAGNOSIS — M9902 Segmental and somatic dysfunction of thoracic region: Secondary | ICD-10-CM | POA: Diagnosis not present

## 2021-02-03 DIAGNOSIS — M62830 Muscle spasm of back: Secondary | ICD-10-CM | POA: Diagnosis not present

## 2021-02-03 DIAGNOSIS — M542 Cervicalgia: Secondary | ICD-10-CM | POA: Diagnosis not present

## 2021-02-05 DIAGNOSIS — M542 Cervicalgia: Secondary | ICD-10-CM | POA: Diagnosis not present

## 2021-02-05 DIAGNOSIS — M9902 Segmental and somatic dysfunction of thoracic region: Secondary | ICD-10-CM | POA: Diagnosis not present

## 2021-02-05 DIAGNOSIS — M9901 Segmental and somatic dysfunction of cervical region: Secondary | ICD-10-CM | POA: Diagnosis not present

## 2021-02-05 DIAGNOSIS — M62830 Muscle spasm of back: Secondary | ICD-10-CM | POA: Diagnosis not present

## 2021-02-10 DIAGNOSIS — M9901 Segmental and somatic dysfunction of cervical region: Secondary | ICD-10-CM | POA: Diagnosis not present

## 2021-02-10 DIAGNOSIS — M62830 Muscle spasm of back: Secondary | ICD-10-CM | POA: Diagnosis not present

## 2021-02-10 DIAGNOSIS — M542 Cervicalgia: Secondary | ICD-10-CM | POA: Diagnosis not present

## 2021-02-10 DIAGNOSIS — M9902 Segmental and somatic dysfunction of thoracic region: Secondary | ICD-10-CM | POA: Diagnosis not present

## 2021-02-12 DIAGNOSIS — M9901 Segmental and somatic dysfunction of cervical region: Secondary | ICD-10-CM | POA: Diagnosis not present

## 2021-02-12 DIAGNOSIS — M62830 Muscle spasm of back: Secondary | ICD-10-CM | POA: Diagnosis not present

## 2021-02-12 DIAGNOSIS — M542 Cervicalgia: Secondary | ICD-10-CM | POA: Diagnosis not present

## 2021-02-12 DIAGNOSIS — M9902 Segmental and somatic dysfunction of thoracic region: Secondary | ICD-10-CM | POA: Diagnosis not present

## 2021-02-17 DIAGNOSIS — M9901 Segmental and somatic dysfunction of cervical region: Secondary | ICD-10-CM | POA: Diagnosis not present

## 2021-02-17 DIAGNOSIS — M542 Cervicalgia: Secondary | ICD-10-CM | POA: Diagnosis not present

## 2021-02-17 DIAGNOSIS — M9902 Segmental and somatic dysfunction of thoracic region: Secondary | ICD-10-CM | POA: Diagnosis not present

## 2021-02-17 DIAGNOSIS — M62830 Muscle spasm of back: Secondary | ICD-10-CM | POA: Diagnosis not present

## 2021-02-19 DIAGNOSIS — M9902 Segmental and somatic dysfunction of thoracic region: Secondary | ICD-10-CM | POA: Diagnosis not present

## 2021-02-19 DIAGNOSIS — M9901 Segmental and somatic dysfunction of cervical region: Secondary | ICD-10-CM | POA: Diagnosis not present

## 2021-02-19 DIAGNOSIS — M542 Cervicalgia: Secondary | ICD-10-CM | POA: Diagnosis not present

## 2021-02-19 DIAGNOSIS — M62830 Muscle spasm of back: Secondary | ICD-10-CM | POA: Diagnosis not present

## 2021-03-10 DIAGNOSIS — M62830 Muscle spasm of back: Secondary | ICD-10-CM | POA: Diagnosis not present

## 2021-03-10 DIAGNOSIS — M9902 Segmental and somatic dysfunction of thoracic region: Secondary | ICD-10-CM | POA: Diagnosis not present

## 2021-03-10 DIAGNOSIS — M9901 Segmental and somatic dysfunction of cervical region: Secondary | ICD-10-CM | POA: Diagnosis not present

## 2021-03-10 DIAGNOSIS — M542 Cervicalgia: Secondary | ICD-10-CM | POA: Diagnosis not present

## 2021-03-12 DIAGNOSIS — M542 Cervicalgia: Secondary | ICD-10-CM | POA: Diagnosis not present

## 2021-03-12 DIAGNOSIS — M9901 Segmental and somatic dysfunction of cervical region: Secondary | ICD-10-CM | POA: Diagnosis not present

## 2021-03-12 DIAGNOSIS — M62830 Muscle spasm of back: Secondary | ICD-10-CM | POA: Diagnosis not present

## 2021-03-12 DIAGNOSIS — M9902 Segmental and somatic dysfunction of thoracic region: Secondary | ICD-10-CM | POA: Diagnosis not present

## 2021-03-17 DIAGNOSIS — M542 Cervicalgia: Secondary | ICD-10-CM | POA: Diagnosis not present

## 2021-03-17 DIAGNOSIS — M9901 Segmental and somatic dysfunction of cervical region: Secondary | ICD-10-CM | POA: Diagnosis not present

## 2021-03-17 DIAGNOSIS — M9902 Segmental and somatic dysfunction of thoracic region: Secondary | ICD-10-CM | POA: Diagnosis not present

## 2021-03-17 DIAGNOSIS — M62830 Muscle spasm of back: Secondary | ICD-10-CM | POA: Diagnosis not present

## 2021-03-19 DIAGNOSIS — M62830 Muscle spasm of back: Secondary | ICD-10-CM | POA: Diagnosis not present

## 2021-03-19 DIAGNOSIS — M9902 Segmental and somatic dysfunction of thoracic region: Secondary | ICD-10-CM | POA: Diagnosis not present

## 2021-03-19 DIAGNOSIS — M542 Cervicalgia: Secondary | ICD-10-CM | POA: Diagnosis not present

## 2021-03-19 DIAGNOSIS — M9901 Segmental and somatic dysfunction of cervical region: Secondary | ICD-10-CM | POA: Diagnosis not present

## 2021-03-23 DIAGNOSIS — R972 Elevated prostate specific antigen [PSA]: Secondary | ICD-10-CM | POA: Diagnosis not present

## 2021-03-24 DIAGNOSIS — M542 Cervicalgia: Secondary | ICD-10-CM | POA: Diagnosis not present

## 2021-03-24 DIAGNOSIS — M62830 Muscle spasm of back: Secondary | ICD-10-CM | POA: Diagnosis not present

## 2021-03-24 DIAGNOSIS — M9902 Segmental and somatic dysfunction of thoracic region: Secondary | ICD-10-CM | POA: Diagnosis not present

## 2021-03-24 DIAGNOSIS — M9901 Segmental and somatic dysfunction of cervical region: Secondary | ICD-10-CM | POA: Diagnosis not present

## 2021-03-26 DIAGNOSIS — M542 Cervicalgia: Secondary | ICD-10-CM | POA: Diagnosis not present

## 2021-03-26 DIAGNOSIS — M9901 Segmental and somatic dysfunction of cervical region: Secondary | ICD-10-CM | POA: Diagnosis not present

## 2021-03-26 DIAGNOSIS — M9902 Segmental and somatic dysfunction of thoracic region: Secondary | ICD-10-CM | POA: Diagnosis not present

## 2021-03-26 DIAGNOSIS — M62830 Muscle spasm of back: Secondary | ICD-10-CM | POA: Diagnosis not present

## 2021-03-30 DIAGNOSIS — R972 Elevated prostate specific antigen [PSA]: Secondary | ICD-10-CM | POA: Diagnosis not present

## 2021-03-30 DIAGNOSIS — R350 Frequency of micturition: Secondary | ICD-10-CM | POA: Diagnosis not present

## 2021-03-31 DIAGNOSIS — M62830 Muscle spasm of back: Secondary | ICD-10-CM | POA: Diagnosis not present

## 2021-03-31 DIAGNOSIS — M9902 Segmental and somatic dysfunction of thoracic region: Secondary | ICD-10-CM | POA: Diagnosis not present

## 2021-03-31 DIAGNOSIS — M542 Cervicalgia: Secondary | ICD-10-CM | POA: Diagnosis not present

## 2021-03-31 DIAGNOSIS — M9901 Segmental and somatic dysfunction of cervical region: Secondary | ICD-10-CM | POA: Diagnosis not present

## 2021-04-02 DIAGNOSIS — M62830 Muscle spasm of back: Secondary | ICD-10-CM | POA: Diagnosis not present

## 2021-04-02 DIAGNOSIS — M542 Cervicalgia: Secondary | ICD-10-CM | POA: Diagnosis not present

## 2021-04-02 DIAGNOSIS — M9902 Segmental and somatic dysfunction of thoracic region: Secondary | ICD-10-CM | POA: Diagnosis not present

## 2021-04-02 DIAGNOSIS — M9901 Segmental and somatic dysfunction of cervical region: Secondary | ICD-10-CM | POA: Diagnosis not present

## 2021-04-07 DIAGNOSIS — M542 Cervicalgia: Secondary | ICD-10-CM | POA: Diagnosis not present

## 2021-04-07 DIAGNOSIS — M62830 Muscle spasm of back: Secondary | ICD-10-CM | POA: Diagnosis not present

## 2021-04-07 DIAGNOSIS — M9901 Segmental and somatic dysfunction of cervical region: Secondary | ICD-10-CM | POA: Diagnosis not present

## 2021-04-07 DIAGNOSIS — M9902 Segmental and somatic dysfunction of thoracic region: Secondary | ICD-10-CM | POA: Diagnosis not present

## 2021-04-14 DIAGNOSIS — M9901 Segmental and somatic dysfunction of cervical region: Secondary | ICD-10-CM | POA: Diagnosis not present

## 2021-04-14 DIAGNOSIS — M62830 Muscle spasm of back: Secondary | ICD-10-CM | POA: Diagnosis not present

## 2021-04-14 DIAGNOSIS — M542 Cervicalgia: Secondary | ICD-10-CM | POA: Diagnosis not present

## 2021-04-14 DIAGNOSIS — M9902 Segmental and somatic dysfunction of thoracic region: Secondary | ICD-10-CM | POA: Diagnosis not present

## 2021-04-21 DIAGNOSIS — M9901 Segmental and somatic dysfunction of cervical region: Secondary | ICD-10-CM | POA: Diagnosis not present

## 2021-04-21 DIAGNOSIS — M62830 Muscle spasm of back: Secondary | ICD-10-CM | POA: Diagnosis not present

## 2021-04-21 DIAGNOSIS — M542 Cervicalgia: Secondary | ICD-10-CM | POA: Diagnosis not present

## 2021-04-21 DIAGNOSIS — M9902 Segmental and somatic dysfunction of thoracic region: Secondary | ICD-10-CM | POA: Diagnosis not present

## 2021-05-26 DIAGNOSIS — E782 Mixed hyperlipidemia: Secondary | ICD-10-CM | POA: Diagnosis not present

## 2021-07-28 ENCOUNTER — Ambulatory Visit (INDEPENDENT_AMBULATORY_CARE_PROVIDER_SITE_OTHER): Payer: BC Managed Care – PPO | Admitting: Orthopaedic Surgery

## 2021-07-28 ENCOUNTER — Ambulatory Visit (INDEPENDENT_AMBULATORY_CARE_PROVIDER_SITE_OTHER): Payer: BC Managed Care – PPO

## 2021-07-28 DIAGNOSIS — M25551 Pain in right hip: Secondary | ICD-10-CM | POA: Diagnosis not present

## 2021-07-28 NOTE — Progress Notes (Signed)
Office Visit Note   Patient: Steven Hendricks           Date of Birth: [birth year]           MRN: [id number] Visit Date: 07/28/2021              Requested by: Elias Else, MD [phone] [address] [place],  [place] [address] PCP: Elias Else, MD   Assessment & Plan: Visit Diagnoses:  1. 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  Certainly he likely has some type of labral tearing which is degenerative at this point.  I would like to send him to my partner Dr. Steward Drone to assess his right hip under ultrasound and consider an intra-articular injection in the right hip joint.  He agrees with this treatment plan.  He will then follow-up with me after that injection at some point but he can follow-up also as needed and come see me if things are worsening.  I did go over his x-ray findings with him and showed him a hip replacement model and explained in detail the anatomy of the hip and what hip replacement surgery involves if he gets to that point where he fails all forms conservative treatment and his pain becomes debilitating and additionally affects his mobility, his quality of life, and his activities day living.  Follow-Up Instructions: No follow-ups on file.   Orders:  Orders Placed This Encounter  Procedures   XR HIP UNILAT W OR W/O PELVIS 1V RIGHT   No orders of the defined types were placed in this encounter.     Procedures: No procedures performed   Clinical Data: No additional findings.   Subjective: Chief Complaint  Patient presents with   Right Hip - Pain  The patient is a very active 56 year old gentleman who comes in with several years worth of right hip pain and groin pain.  He says he has a lot of pain when he is first getting up from a sitting position and stretches every morning.  Sometimes he has a popping sensation in the right hip when he over stretches but he  does feel better sometimes when he stretches.  He does have a remote history of an intra-articular injection in his right hip many years ago and was told at 1 point he may have a labral tear.  He was very athletic growing up and still athletic.  He is never had surgery to that right hip and denies any injury itself.  It is not debilitating yet in terms of the pain but it is slowly getting worse for him.  He does not walk with a limp.  He does not have any significant medical issues either  HPI  Review of Systems There is currently listed no fever, chills, nausea, vomiting  Objective: Vital Signs: There were no vitals taken for this visit.  Physical Exam He is alert and orient x3 and in no acute distress Ortho Exam Examination of his left hip shows it moves smoothly and fluidly with pain on the extremes of rotation.  There is no blocks to rotation at all.  I can feel a clicking sensation when he lay supine and I really Hyperflex his right hip. Specialty Comments:  No specialty comments available.  Imaging: XR HIP UNILAT W OR W/O PELVIS 1V RIGHT  Result Date: 07/28/2021 An AP pelvis and lateral of  the right hip shows slight joint space narrowing and a small para-articular osteophyte at the lateral femoral head and one of the acetabulum.  This is subtle on the left side as well but worse on the right side.  There is likely evidence of previous femoral acetabular impingement.    PMFS History: There are no problems to display for this patient.  Past Medical History:  Diagnosis Date   Asthma    Diverticulosis    Hemorrhoids    History of hernia repair    right   IBS (irritable bowel syndrome)    Ulnar nerve compression     Family History  Problem Relation Age of Onset   Irritable bowel syndrome Sister    Crohn's disease Sister     Past Surgical History:  Procedure Laterality Date   COLONOSCOPY     HERNIA REPAIR     Social History   Occupational History   Not on file   Tobacco Use   Smoking status: Some Days    Types: Cigars   Smokeless tobacco: Never   Tobacco comments:    2 or 3 a year  Substance and Sexual Activity   Alcohol use: Yes    Comment: weekly   Drug use: No   Sexual activity: Not on file

## 2021-10-07 ENCOUNTER — Ambulatory Visit: Payer: BC Managed Care – PPO | Attending: Internal Medicine | Admitting: Internal Medicine

## 2021-10-07 ENCOUNTER — Encounter: Payer: Self-pay | Admitting: Internal Medicine

## 2021-10-07 VITALS — HR 95 | Wt 175.0 lb

## 2021-10-07 DIAGNOSIS — E785 Hyperlipidemia, unspecified: Secondary | ICD-10-CM

## 2021-10-07 NOTE — Patient Instructions (Signed)
Medication Instructions:  NO CHANGES  *If you need a refill on your cardiac medications before your next appointment, please call your pharmacy*   Lab Work: FASTING lab work in 3-4 months to check cholesterol   If you have labs (blood work) drawn today and your tests are completely normal, you will receive your results only by: MyChart Message (if you have MyChart) OR A paper copy in the mail If you have any lab test that is abnormal or we need to change your treatment, we will call you to review the results.   Testing/Procedures: Dr. Rennis Golden has ordered a CT coronary calcium score.   Test locations:  [facility]   This is $99 out of pocket.   Coronary CalciumScan A coronary calcium scan is an imaging test used to look for deposits of calcium and other fatty materials (plaques) in the inner lining of the blood vessels of the heart (coronary arteries). These deposits of calcium and plaques can partly clog and narrow the coronary arteries without producing any symptoms or warning signs. This puts a person at risk for a heart attack. This test can detect these deposits before symptoms develop. Tell a health care provider about: Any allergies you have. All medicines you are taking, including vitamins, herbs, eye drops, creams, and over-the-counter medicines. Any problems you or family members have had with anesthetic medicines. Any blood disorders you have. Any surgeries you have had. Any medical conditions you have. Whether you are pregnant or may be pregnant. What are the risks? Generally, this is a safe procedure. However, problems may occur, including: Harm to a pregnant woman and her unborn baby. This test involves the use of radiation. Radiation exposure can be dangerous to a pregnant woman and her unborn baby. If you are pregnant, you generally should not have this procedure done. Slight increase in the risk of cancer. This is because of the  radiation involved in the test. What happens before the procedure? No preparation is needed for this procedure. What happens during the procedure? You will undress and remove any jewelry around your neck or chest. You will put on a hospital gown. Sticky electrodes will be placed on your chest. The electrodes will be connected to an electrocardiogram (ECG) machine to record a tracing of the electrical activity of your heart. A CT scanner will take pictures of your heart. During this time, you will be asked to lie still and hold your breath for 2-3 seconds while a picture of your heart is being taken. The procedure may vary among health care providers and hospitals. What happens after the procedure? You can get dressed. You can return to your normal activities. It is up to you to get the results of your test. Ask your health care provider, or the department that is doing the test, when your results will be ready. Summary A coronary calcium scan is an imaging test used to look for deposits of calcium and other fatty materials (plaques) in the inner lining of the blood vessels of the heart (coronary arteries). Generally, this is a safe procedure. Tell your health care provider if you are pregnant or may be pregnant. No preparation is needed for this procedure. A CT scanner will take pictures of your heart. You can return to your normal activities after the scan is done. This information is not intended to replace advice given to you by your health care provider. Make sure you discuss any questions you have with your health  care provider. Document Released: 06/19/2007 Document Revised: 11/10/2015 Document Reviewed: 11/10/2015 Elsevier Interactive Patient Education  2017 [place]..    Follow-Up: At [facility], you and your health needs are our priority.  As part of our continuing mission to provide you with exceptional heart care, we have created designated Provider Care Teams.   These Care Teams include your primary Cardiologist (physician) and Advanced Practice Providers (APPs -  Physician Assistants and Nurse Practitioners) who all work together to provide you with the care you need, when you need it.  We recommend signing up for the patient portal called "MyChart".  Sign up information is provided on this After Visit Summary.  MyChart is used to connect with patients for Virtual Visits (Telemedicine).  Patients are able to view lab/test results, encounter notes, upcoming appointments, etc.  Non-urgent messages can be sent to your provider as well.   To learn more about what you can do with MyChart, go to [email or web address].    Your next appointment:    3-4 months with Dr. Debara Pickett -- lipid clinic

## 2021-10-07 NOTE — Progress Notes (Signed)
Virtual Visit via Video Note   Because of Steven Hendricks co-morbid illnesses, he is at least at moderate risk for complications without adequate follow up.  This format is felt to be most appropriate for this patient at this time.  All issues noted in this document were discussed and addressed.  A limited physical exam was performed with this format.  Please refer to the patient's chart for his consent to telehealth for [facility]      Date:  10/07/2021   ID:  Steven Hendricks, DOB 1965-02-09, MRN [id number] The patient was identified using 2 identifiers.  Evaluation Performed:  New Patient Evaluation  Patient Location:  [place] [address]  Provider location:   [address], [address] [place] [address] [place], [place] [address]  PCP:  Steven Dus, MD  Cardiologist:  None Electrophysiologist:  None   Chief Complaint: Manage dyslipidemia  History of Present Illness:    Steven Hendricks is a 56 y.o. male who presents via audio/video conferencing for a telehealth visit today.  This is a pleasant 56 year old male kindly referred for evaluation management of dyslipidemia.  This has been a longstanding issue for him.  He says he had previously tried a statin but stopped taking it because he has general concerns about taking medications.  He wanted to try to manage it with diet and exercise.  He has no known cardiovascular disease.  He had prior imaging of his heart including an MRI back in 2007 which showed normal structure.  He has had CTs of the chest back in 2014 and 2015 which showed no coronary calcification.  He had carotid Dopplers which showed minimal to no carotid atherosclerosis in 2018.  Despite this his cholesterol has remained high with a mixed dyslipidemia and high LDL particle number, small LDL particle numbers, and low HDL cholesterol.  His last lipid profile was in May of this year.  This demonstrated LDL particle number of 2215, total cholesterol 228,  LDL-C164, HDL-C of 37, triglycerides 146, and small LDL particle #1359.  He reports fairly healthy diet low in saturated fats.  He is generally active and not overweight.  He had a former smoking history, mostly cigars but quit a number of years ago.  He was noted to have a lung nodule but it was favored to be benign on imaging about 8 years ago.  He does not know his mother's side of the family but notes his father did not have any early onset heart disease or high cholesterol.  He had previously been offered ezetimibe as well and fenofibrate however has declined and was more interested in the lipid clinic evaluation.  Prior CV studies:   The following studies were reviewed today:  Chart reviewed, lab work  PMHx:  Past Medical History:  Diagnosis Date   Asthma    Diverticulosis    Hemorrhoids    History of hernia repair    right   IBS (irritable bowel syndrome)    Ulnar nerve compression     Past Surgical History:  Procedure Laterality Date   COLONOSCOPY     HERNIA REPAIR      FAMHx:  Family History  Problem Relation Age of Onset   Irritable bowel syndrome Sister    Crohn's disease Sister     SOCHx:   reports that he has been smoking cigars. He has never used smokeless tobacco. He reports current alcohol use. He reports that he does not use drugs.  ALLERGIES:  No Known Allergies  MEDS:  Current Meds  Medication Sig   albuterol (VENTOLIN HFA) 108 (90 Base) MCG/ACT inhaler Inhale 2 puffs into the lungs every 4 (four) hours as needed.   alfuzosin (UROXATRAL) 10 MG 24 hr tablet Take 1 tablet by mouth at bedtime.   Multiple Vitamin (MULTI-VITAMIN) tablet Take 1 tablet by mouth daily.     ROS: Pertinent items noted in HPI and remainder of comprehensive ROS otherwise negative.  Labs/Other Tests and Data Reviewed:    Recent Labs: No results found for requested labs within last 365 days.   Recent Lipid Panel No results found for: "CHOL", "TRIG", "HDL", "CHOLHDL",  "LDLCALC", "LDLDIRECT"  Wt Readings from Last 3 Encounters:  10/07/21 175 lb (79.4 kg)  07/04/20 184 lb (83.5 kg)  12/14/19 184 lb 4 oz (83.6 kg)     Exam:    Vital Signs:  Pulse 95   Wt 175 lb (79.4 kg)   BMI 23.73 kg/m    General appearance: alert and no distress Lungs: No wheezes Abdomen: Normal weight Extremities: extremities normal, atraumatic, no cyanosis or edema Neurologic: Grossly normal  ASSESSMENT & PLAN:    Mixed dyslipidemia Statin averse  Mr. Shonka has a mixed dyslipidemia with elevated LDL particle numbers and cholesterol.  His goal LDL is less than 100 based on risk factors.  We could better restratify him I think with coronary calcium score.  If this is significantly elevated, I would target a lower LDL cholesterol and strongly recommend trial of statin therapy.  Otherwise still I think he would benefit from medical therapy as his diet already seems somewhat optimized and he is fairly active.  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  He is currently not on board with medical therapy however hopefully more coronary evaluation would be helpful to determine his risk.  We will plan to pursue a calcium score and consider therapies afterwards.  Plan follow-up lipids including NMR and LP(a) in about 3 to 4 months after either medical therapy and/or diet and lifestyle modifications.  Thanks again for the kind referral.  Patient Risk:   After full review of this patients clinical status, I feel that they are at least moderate risk at this time.  Time:   Today, I have spent 25 minutes with the patient with telehealth technology discussing dyslipidemia.     Medication Adjustments/Labs and Tests Ordered: Current medicines are reviewed at length with the patient today.  Concerns regarding medicines are outlined above.   Tests Ordered: Orders Placed This Encounter  Procedures   CT CARDIAC SCORING (SELF PAY ONLY)   NMR, lipoprofile   Lipoprotein A (LPA)     Medication Changes: No orders of the defined types were placed in this encounter.   Disposition:  in 4 month(s)  Steven Nose, MD, [facility], FACP  [place]  [facility]  Medical Director of the Advanced Lipid Disorders &  Cardiovascular Risk Reduction Clinic Diplomate of the American Board of Clinical Lipidology Attending Cardiologist  Direct Dial: [phone]  Fax: [phone]  Website:  [email or web address]  Steven Nose, MD  10/07/2021 9:42 AM

## 2021-11-20 ENCOUNTER — Ambulatory Visit (HOSPITAL_BASED_OUTPATIENT_CLINIC_OR_DEPARTMENT_OTHER)
Admission: RE | Admit: 2021-11-20 | Discharge: 2021-11-20 | Disposition: A | Discharge status: Home / Self Care | Payer: BC Managed Care – PPO | Source: Ambulatory Visit | Attending: Internal Medicine | Admitting: Internal Medicine

## 2021-11-20 DIAGNOSIS — E785 Hyperlipidemia, unspecified: Secondary | ICD-10-CM | POA: Insufficient documentation

## 2021-11-30 DIAGNOSIS — M353 Polymyalgia rheumatica: Secondary | ICD-10-CM | POA: Diagnosis not present

## 2021-11-30 DIAGNOSIS — Z0000 Encounter for general adult medical examination without abnormal findings: Secondary | ICD-10-CM | POA: Diagnosis not present

## 2021-11-30 DIAGNOSIS — T733XXA Exhaustion due to excessive exertion, initial encounter: Secondary | ICD-10-CM | POA: Diagnosis not present

## 2021-11-30 DIAGNOSIS — E782 Mixed hyperlipidemia: Secondary | ICD-10-CM | POA: Diagnosis not present

## 2021-11-30 DIAGNOSIS — G2581 Restless legs syndrome: Secondary | ICD-10-CM | POA: Diagnosis not present

## 2021-11-30 DIAGNOSIS — R0602 Shortness of breath: Secondary | ICD-10-CM | POA: Diagnosis not present

## 2021-11-30 DIAGNOSIS — Z125 Encounter for screening for malignant neoplasm of prostate: Secondary | ICD-10-CM | POA: Diagnosis not present

## 2021-12-07 NOTE — Progress Notes (Unsigned)
Office Visit    Patient Name: Steven Hendricks Date of Encounter: 12/07/2021  Primary Care Provider:  Elias Else, MD Primary Cardiologist:  None Primary Electrophysiologist: None  Chief Complaint    Steven Hendricks is a 56 y.o. male with PMH of exertional dyspnea, HLD, PVCs, aortic atherosclerosis, asthma who presents today for complaint of dyspnea on exertion.  Past Medical History    Past Medical History:  Diagnosis Date   Asthma    Diverticulosis    Hemorrhoids    History of hernia repair    right   IBS (irritable bowel syndrome)    Ulnar nerve compression    Past Surgical History:  Procedure Laterality Date   COLONOSCOPY     HERNIA REPAIR      Allergies  No Known Allergies  History of Present Illness    Steven Hendricks  is a 56 year old male with the above mention past medical history who presents today for complaint of dyspnea on exertion.  Steven Hendricks was seen initially by Dr. Katrinka Blazing and 2022 after developing tachycardia, fatigue and occasional shortness of breath.  He has a history of PVCs and underwent cardiac MR to rule out RV dysplasia.  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  He was most recently seen by Dr. Rennis Golden by referral for lipid management.  He underwent CT of the chest that showed evidence of aortic atherosclerosis.  He was sent for coronary calcium score for better risk stratification that revealed low score of 0.72 and no evidence of coronary artery disease.  Patient declined and medical therapy at that time.  Since last being seen in the office patient reports***.  Patient denies chest pain, palpitations, dyspnea, PND, orthopnea, nausea, vomiting, dizziness, syncope, edema, weight gain, or early satiety.     ***Notes: -Fatigue on exertion increased shortness of breath Home Medications    Current Outpatient Medications  Medication Sig Dispense Refill   albuterol (VENTOLIN HFA) 108 (90 Base)  MCG/ACT inhaler Inhale 2 puffs into the lungs every 4 (four) hours as needed.     alfuzosin (UROXATRAL) 10 MG 24 hr tablet Take 1 tablet by mouth at bedtime.     COVID-19 Ad26 vaccine, JANSSEN/J&J, 0.5 ML injection Inject into the muscle. 0.5 mL 0   Multiple Vitamin (MULTI-VITAMIN) tablet Take 1 tablet by mouth daily.     No current facility-administered medications for this visit.     Review of Systems  Please see the history of present illness.    (+)*** (+)***  All other systems reviewed and are otherwise negative except as noted above.  Physical Exam    Wt Readings from Last 3 Encounters:  10/07/21 175 lb (79.4 kg)  07/04/20 184 lb (83.5 kg)  12/14/19 184 lb 4 oz (83.6 kg)   XB:DZHGD were no vitals filed for this visit.,There is no height or weight on file to calculate BMI.  Constitutional:      Appearance: Healthy appearance. Not in distress.  Neck:     Vascular: JVD normal.  Pulmonary:     Effort: Pulmonary effort is normal.     Breath sounds: No wheezing. No rales. Diminished in the bases Cardiovascular:     Normal rate. Regular rhythm. Normal S1. Normal S2.      Murmurs: There is no murmur.  Edema:    Peripheral edema absent.  Abdominal:     Palpations: Abdomen is soft non tender. There is no hepatomegaly.  Skin:    General: Skin is warm and dry.  Neurological:     General: No focal deficit present.     Mental Status: Alert and oriented to person, place and time.     Cranial Nerves: Cranial nerves are intact.  EKG/LABS/Other Studies Reviewed    ECG personally reviewed by me today - ***  Risk Assessment/Calculations:   {Does this patient have ATRIAL FIBRILLATION?:[phone]}        Lab Results  Component Value Date   WBC 7.0 02/22/2020   HGB 16.8 02/22/2020   HCT 48.2 02/22/2020   MCV 89.8 02/22/2020   PLT 242 02/22/2020   Lab Results  Component Value Date   CREATININE 1.28 (H) 02/22/2020   BUN 21 (H) 02/22/2020   NA 135 02/22/2020   K 3.1 (L)  02/22/2020   CL 99 02/22/2020   CO2 23 02/22/2020   Lab Results  Component Value Date   ALT 20 02/22/2020   AST 21 02/22/2020   ALKPHOS 54 02/22/2020   BILITOT 0.6 02/22/2020   No results found for: "CHOL", "HDL", "LDLCALC", "LDLDIRECT", "TRIG", "CHOLHDL"  No results found for: "HGBA1C"  Assessment & Plan    1.  Lower extremity edema: -Today patient reports***  2.  Nonobstructive CAD: -Coronary calcium score complete 2023 with score of 0.73 minimal atherosclerosis. -Patient declined medical therapy at that time.  3.***  4.***      Disposition: Follow-up with None or APP in *** months {Are you ordering a CV Procedure (e.g. stress test, cath, DCCV, TEE, etc)?   Press F2        :[id number]}   Medication Adjustments/Labs and Tests Ordered: Current medicines are reviewed at length with the patient today.  Concerns regarding medicines are outlined above.   Signed, Napoleon Form, Leodis Rains, NP 12/07/2021, 1:16 PM [facility]  Note:  This document was prepared using Dragon voice recognition software and may include unintentional dictation errors.

## 2021-12-08 ENCOUNTER — Encounter: Payer: Self-pay | Admitting: Nurse Practitioner

## 2021-12-08 ENCOUNTER — Telehealth: Payer: Self-pay | Admitting: *Deleted

## 2021-12-08 ENCOUNTER — Ambulatory Visit: Payer: BC Managed Care – PPO | Attending: Nurse Practitioner | Admitting: Nurse Practitioner

## 2021-12-08 VITALS — BP 120/82 | HR 79 | Ht 73.0 in | Wt 186.0 lb

## 2021-12-08 DIAGNOSIS — G479 Sleep disorder, unspecified: Secondary | ICD-10-CM

## 2021-12-08 DIAGNOSIS — R0602 Shortness of breath: Secondary | ICD-10-CM | POA: Diagnosis not present

## 2021-12-08 DIAGNOSIS — Z8709 Personal history of other diseases of the respiratory system: Secondary | ICD-10-CM

## 2021-12-08 NOTE — Telephone Encounter (Signed)
Pt was seen in the office today with Robin Searing, NP who ordered an Itamar study. Pt aware to not open the box until he has been called with the PIN#. Pt agreeable to signed waiver.

## 2021-12-08 NOTE — Patient Instructions (Addendum)
Medication Instructions:  Your physician recommends that you continue on your current medications as directed. Please refer to the Current Medication list given to you today. *If you need a refill on your cardiac medications before your next appointment, please call your pharmacy*   Lab Work: None ordered   Testing/Procedures: Your physician has requested that you have an echocardiogram. Echocardiography is a painless test that uses sound waves to create images of your heart. It provides your doctor with information about the size and shape of your heart and how well your heart's chambers and valves are working. This procedure takes approximately one hour. There are no restrictions for this procedure. Please do NOT wear cologne, perfume, aftershave, or lotions (deodorant is allowed). Please arrive 15 minutes prior to your appointment time.  Itamar Sleep Study  Follow-Up: At [facility], you and your health needs are our priority.  As part of our continuing mission to provide you with exceptional heart care, we have created designated Provider Care Teams.  These Care Teams include your primary Cardiologist (physician) and Advanced Practice Providers (APPs -  Physician Assistants and Nurse Practitioners) who all work together to provide you with the care you need, when you need it.  We recommend signing up for the patient portal called "MyChart".  Sign up information is provided on this After Visit Summary.  MyChart is used to connect with patients for Virtual Visits (Telemedicine).  Patients are able to view lab/test results, encounter notes, upcoming appointments, etc.  Non-urgent messages can be sent to your provider as well.   To learn more about what you can do with MyChart, go to [email or web address].    Your next appointment:   Pending Echo Results   The format for your next appointment:   In Person  Provider:   Robin Searing, NP        Other Instructions   Important  Information About Sugar

## 2021-12-18 ENCOUNTER — Telehealth: Payer: Self-pay | Admitting: *Deleted

## 2021-12-18 NOTE — Telephone Encounter (Signed)
Prior Authorization for [employer] sent to [employer] via web portal. Tracking Number .   READY- NO PA REQ

## 2021-12-18 NOTE — Telephone Encounter (Signed)
I s/w the pt and he has been given PIN# [id number] and ok to proceed with Itamar study. Pt states he will do sleep study between tonight and early next week. Once results are in we will call him with any recommendations.   Called and made the patient aware that he may proceed with the [facility] Sleep Study. PIN # provided to the patient. Patient made aware that he will be contacted after the test has been read with the results and any recommendations. Patient verbalized understanding and thanked me for the call.

## 2021-12-18 NOTE — Telephone Encounter (Signed)
Pt called today asking about Itamar study if he has been approved. I asked the operator if she will please relay message to the pt that I will d/w Coralee North, sleep coordinator if pt has been approved. I will call back as soon as I have an approval.

## 2021-12-18 NOTE — Telephone Encounter (Signed)
-----   Message from Tarri Fuller, CMA sent at 12/08/2021 10:03 AM EST ----- Regarding: 12/5 SET UP Donnie Coffin; AWAITING AUTH ITAMAR SET UP 12/08/21 (Newest Message First) View All Conversations on this Encounter You routed conversation to YouJust now (10:03 AM)  YouJust now (10:02 AM)   Pt was seen in the office today with Robin Searing, NP who ordered an [place] study. Pt aware to not open the box until he has been called with the PIN#. Pt agreeable to signed waiver.

## 2021-12-21 ENCOUNTER — Ambulatory Visit: Payer: BC Managed Care – PPO | Attending: Nurse Practitioner

## 2021-12-21 DIAGNOSIS — R0602 Shortness of breath: Secondary | ICD-10-CM

## 2021-12-22 ENCOUNTER — Encounter (HOSPITAL_BASED_OUTPATIENT_CLINIC_OR_DEPARTMENT_OTHER): Payer: BC Managed Care – PPO | Admitting: Cardiology

## 2021-12-22 DIAGNOSIS — R0602 Shortness of breath: Secondary | ICD-10-CM

## 2021-12-22 DIAGNOSIS — R0683 Snoring: Secondary | ICD-10-CM | POA: Diagnosis not present

## 2021-12-23 NOTE — Procedures (Signed)
       SLEEP STUDY REPORT Patient Information Study Date: 12/22/2021 Patient Name: Steven Hendricks Patient ID: [id number] Birth Date: [birth year] Age: 56 Gender: Male BMI: 24.5 (W=185 lb, H=6' 1'') Stopbang: 4 Referring Physician: Robin Searing, NP   TEST DESCRIPTION: Home sleep apnea testing was completed using the WatchPat, a Type 1 device, utilizing peripheral arterial tonometry (PAT), chest movement, actigraphy, pulse oximetry, pulse rate, body position and snore. AHI was calculated with apnea and hypopnea using valid sleep time as the denominator. RDI includes apneas, hypopneas, and RERAs. The data acquired and the scoring of sleep and all associated events were performed in accordance with the recommended standards and specifications as outlined in the AASM Manual for the Scoring of Sleep and Associated Events 2.2.0 (2015).   FINDINGS: 1.  No evidence of Obstructive Sleep Apnea with AHI 2.9/hr.  2.  No Central Sleep Apnea. 3.  Oxygen desaturations as low as 87%. 4.  Mild snoring was present. O2 sats were < 88% for 0.2 minutes. 5.  Total sleep time was 6 hrs and 54 min. 6.  20.5% of total sleep time was spent in REM sleep.  7.  Prolonged sleep onset latency at 50 min.  8.  Shortened REM sleep onset latency at 70 min.  9.  Total awakenings were 16.  10.  Arrhythmia Detection:  Possible atrial arrhythmias lasting 1 min and 38 seconds.  This is not diagnostic of atrial fibrillation and followup testing should be considered if clinically indicated.   DIAGNOSIS:  Normal study with no significant sleep disordered breathing. Cannot rule out atrial arrhythmias.   RECOMMENDATIONS:   1. Normal study with no significant sleep disordered breathing.  2.  Healthy sleep recommendations include:  adequate nightly sleep (normal 7-9 hrs/night), avoidance of caffeine after noon and alcohol near bedtime, and maintaining a sleep environment that is cool, dark and quiet.  3.  Weight loss for  overweight patients is recommended.    4.  Snoring recommendations include:  weight loss where appropriate, side sleeping, and avoidance of alcohol before bed.  5.  Operation of motor vehicle or dangerous equipment must be avoided when feeling drowsy, excessively sleepy, or mentally fatigued.    6.  An ENT consultation which may be useful for specific causes of and possible treatment of bothersome snoring.   7. Weight loss may be of benefit in reducing the severity of snoring.   8.  Consider outpatient event monitor to assess for atrial fibrillation if clinically indicated.  Signature:   Armanda Magic, MD; [facility]; Diplomat, American Board of Sleep Medicine Electronically Signed: 12/23/2021

## 2021-12-25 ENCOUNTER — Telehealth: Payer: Self-pay | Admitting: *Deleted

## 2021-12-25 NOTE — Telephone Encounter (Signed)
Patient notified of sleep study results. All questions were answered. 

## 2021-12-25 NOTE — Telephone Encounter (Signed)
-----   Message from Quintella Reichert, MD sent at 12/23/2021 11:20 AM EST ----- Please let patient know that sleep study showed no significant sleep apnea.

## 2021-12-29 ENCOUNTER — Ambulatory Visit (HOSPITAL_COMMUNITY): Payer: BC Managed Care – PPO | Attending: Nurse Practitioner

## 2021-12-29 DIAGNOSIS — R0602 Shortness of breath: Secondary | ICD-10-CM

## 2021-12-29 LAB — ECHOCARDIOGRAM COMPLETE
Area-P 1/2: 3.53 cm2
S' Lateral: 3 cm

## 2021-12-31 NOTE — Telephone Encounter (Signed)
Call placed to pt regarding approval for his Itamar.  Left a message for pt to call back.

## 2022-01-01 NOTE — Telephone Encounter (Signed)
I s/w the pt and he stated that we spoke the other day and I gave him the PIN# and he did the sleep study. I apologized to the pt for the additional call. My error that I forgot to note that I had called him back the other day with the PIN#. Pt stated that was fine.

## 2022-03-01 ENCOUNTER — Encounter: Payer: Self-pay | Admitting: Internal Medicine

## 2022-03-01 ENCOUNTER — Ambulatory Visit: Payer: BC Managed Care – PPO | Attending: Cardiology | Admitting: Internal Medicine

## 2022-03-01 VITALS — Wt 175.0 lb

## 2022-03-01 DIAGNOSIS — E785 Hyperlipidemia, unspecified: Secondary | ICD-10-CM

## 2022-03-01 DIAGNOSIS — I2510 Atherosclerotic heart disease of native coronary artery without angina pectoris: Secondary | ICD-10-CM

## 2022-03-01 DIAGNOSIS — I2584 Coronary atherosclerosis due to calcified coronary lesion: Secondary | ICD-10-CM

## 2022-03-01 MED ORDER — ROSUVASTATIN CALCIUM 20 MG PO TABS: 20.0000 mg | ORAL_TABLET | Freq: Every day | ORAL | 3 refills | Status: DC

## 2022-03-01 NOTE — Patient Instructions (Signed)
Medication Instructions:  START rosuvastatin (crestor) '20mg'$  daily  If you have any lab test that is abnormal or we need to change your treatment, we will call you to review the results.   Lab Work: FASTING NMR lipoprofile and LPa in 3-4 months ** complete about 1 week before next appointment   Follow-Up: At [facility], you and your health needs are our priority.  As part of our continuing mission to provide you with exceptional heart care, we have created designated Provider Care Teams.  These Care Teams include your primary Cardiologist (physician) and Advanced Practice Providers (APPs -  Physician Assistants and Nurse Practitioners) who all work together to provide you with the care you need, when you need it.  We recommend signing up for the patient portal called "MyChart".  Sign up information is provided on this After Visit Summary.  MyChart is used to connect with patients for Virtual Visits (Telemedicine).  Patients are able to view lab/test results, encounter notes, upcoming appointments, etc.  Non-urgent messages can be sent to your provider as well.   To learn more about what you can do with MyChart, go to [email or web address].    Your next appointment:    3-4 months with Dr. Debara Pickett -- in-person or video visit

## 2022-03-01 NOTE — Progress Notes (Signed)
Virtual Visit via Video Note   Because of Steven Hendricks co-morbid illnesses, he is at least at moderate risk for complications without adequate follow up.  This format is felt to be most appropriate for this patient at this time.  All issues noted in this document were discussed and addressed.  A limited physical exam was performed with this format.  Please refer to the patient's chart for his consent to telehealth for [facility].      Date:  03/01/2022   ID:  Steven Hendricks, DOB [birth year], MRN [id number] The patient was identified using 2 identifiers.  Evaluation Performed:  New Patient Evaluation  Patient Location:  [address] [place] [address]  Provider location:   [address], [place] [address], [place] [address]  PCP:  Maury Dus, MD (Inactive)  Cardiologist:  None Electrophysiologist:  None   Chief Complaint: Follow-up dyslipidemia  History of Present Illness:    Steven Hendricks is a 57 y.o. male who presents via audio/video conferencing for a telehealth visit today.  This is a pleasant 57 year old male kindly referred for evaluation management of dyslipidemia.  This has been a longstanding issue for him.  He says he had previously tried a statin but stopped taking it because he has general concerns about taking medications.  He wanted to try to manage it with diet and exercise.  He has no known cardiovascular disease.  He had prior imaging of his heart including an MRI back in 2007 which showed normal structure.  He has had CTs of the chest back in 2014 and 2015 which showed no coronary calcification.  He had carotid Dopplers which showed minimal to no carotid atherosclerosis in 2018.  Despite this his cholesterol has remained high with a mixed dyslipidemia and high LDL particle number, small LDL particle numbers, and low HDL cholesterol.  His last lipid profile was in May of this year.  This demonstrated LDL particle number of 2215,  total cholesterol 228, LDL-C164, HDL-C of 37, triglycerides 146, and small LDL particle #1359.  He reports fairly healthy diet low in saturated fats.  He is generally active and not overweight.  He had a former smoking history, mostly cigars but quit a number of years ago.  He was noted to have a lung nodule but it was favored to be benign on imaging about 8 years ago.  He does not know his mother's side of the family but notes his father did not have any early onset heart disease or high cholesterol.  He had previously been offered ezetimibe as well and fenofibrate however has declined and was more interested in the lipid clinic evaluation.  03/01/2022  [name] returns today for follow-up of dyslipidemia.  He underwent calcium scoring in November.  This demonstrated aortic atherosclerosis and small bilateral pulmonary nodules which were considered benign and present since 2013.  He did have some coronary calcification in the LAD with a score of 0.72, 7th percentile for age and sex matched controls.  A repeat lipid profile showed an LDL particle number of 2201 with an LDL-C of 165, HDL-C of 40, triglycerides 164 and small LDL P of 1416.  LP(a) was ordered but not performed.  Prior CV studies:   The following studies were reviewed today:  Chart reviewed, lab work  PMHx:  Past Medical History:  Diagnosis Date   Asthma    Diverticulosis    Hemorrhoids    History of hernia repair  right   IBS (irritable bowel syndrome)    Ulnar nerve compression     Past Surgical History:  Procedure Laterality Date   COLONOSCOPY     HERNIA REPAIR      FAMHx:  Family History  Problem Relation Age of Onset   Irritable bowel syndrome Sister    Crohn's disease Sister     SOCHx:   reports that he has been smoking cigars. He has never used smokeless tobacco. He reports current alcohol use. He reports that he does not use drugs.  ALLERGIES:  No Known Allergies  MEDS:  Current Meds  Medication  Sig   albuterol (VENTOLIN HFA) 108 (90 Base) MCG/ACT inhaler Inhale 2 puffs into the lungs every 4 (four) hours as needed.   alfuzosin (UROXATRAL) 10 MG 24 hr tablet Take 1 tablet by mouth at bedtime.   Ascorbic Acid (VITAMIN C) 100 MG tablet Take 100 mg by mouth daily.   cholecalciferol (VITAMIN D3) 25 MCG (1000 UNIT) tablet Take 1,000 Units by mouth daily.   magnesium 30 MG tablet Take 30 mg by mouth daily.   milk thistle 175 MG tablet Take 175 mg by mouth daily.   Misc Natural Products (OSTEO BI-FLEX ADV JOINT SHIELD) TABS Take 1 tablet by mouth daily.   Multiple Vitamin (MULTI-VITAMIN) tablet Take 1 tablet by mouth daily.   Omega-3 Fatty Acids (FISH OIL) 300 MG CAPS Take 1 capsule by mouth daily.   rosuvastatin (CRESTOR) 20 MG tablet Take 1 tablet (20 mg total) by mouth daily.   Turmeric (QC TUMERIC COMPLEX PO) Take 1 tablet by mouth daily.     ROS: Pertinent items noted in HPI and remainder of comprehensive ROS otherwise negative.  Labs/Other Tests and Data Reviewed:    Recent Labs: No results found for requested labs within last 365 days.   Recent Lipid Panel No results found for: "CHOL", "TRIG", "HDL", "CHOLHDL", "LDLCALC", "LDLDIRECT"  Wt Readings from Last 3 Encounters:  03/01/22 175 lb (79.4 kg)  12/08/21 186 lb (84.4 kg)  10/07/21 175 lb (79.4 kg)     Exam:    Vital Signs:  Wt 175 lb (79.4 kg)   BMI 23.09 kg/m    General appearance: alert and no distress Lungs: No wheezes Abdomen: Normal weight Extremities: extremities normal, atraumatic, no cyanosis or edema Neurologic: Grossly normal  ASSESSMENT & PLAN:    Mixed dyslipidemia, goal LDL less than 70 CAC score of 0.72, 37th percentile (11/2021)  Mr. Frenz does have some calcification in the LAD although overall percentile of coronary calcification was low compared to peers.  This is largely reassuring however aggressive medical therapy will help [place] to maintain this.  We discussed diet and lifestyle options  however it is unlikely he would get the 50% reduction in cholesterol that is necessary to keep his risk low.  He ultimately was agreeable to retrialing statin therapy.  Will start rosuvastatin 20 mg daily.  He believes he may have been on simvastatin in the past which was associated with joint pains however this was around the time that he was diagnosed with the rheumatoid arthritis.  Follow-up lipid NMR and LP(a) in about 3 to 4 months.  Patient Risk:   After full review of this patients clinical status, I feel that they are at least moderate risk at this time.  Time:   Today, I have spent 25 minutes with the patient with telehealth technology discussing dyslipidemia.     Medication Adjustments/Labs and Tests Ordered: Current medicines  are reviewed at length with the patient today.  Concerns regarding medicines are outlined above.   Tests Ordered: Orders Placed This Encounter  Procedures   NMR, lipoprofile   Lipoprotein A (LPA)    Medication Changes: Meds ordered this encounter  Medications   rosuvastatin (CRESTOR) 20 MG tablet    Sig: Take 1 tablet (20 mg total) by mouth daily.    Dispense:  90 tablet    Refill:  3    Disposition:  in 4 month(s)  Pixie Casino, MD, [facility], [place] Director of the Advanced Lipid Disorders &  Cardiovascular Risk Reduction Clinic Diplomate of the American Board of Clinical Lipidology Attending Cardiologist  Direct Dial: [phone]  Fax: [phone]  Website:  [email or web address]  Pixie Casino, MD  03/01/2022 8:58 AM

## 2022-03-22 ENCOUNTER — Other Ambulatory Visit: Payer: Self-pay | Admitting: Gastroenterology

## 2022-03-22 DIAGNOSIS — R109 Unspecified abdominal pain: Secondary | ICD-10-CM

## 2022-03-22 DIAGNOSIS — K219 Gastro-esophageal reflux disease without esophagitis: Secondary | ICD-10-CM | POA: Diagnosis not present

## 2022-03-22 DIAGNOSIS — Z1211 Encounter for screening for malignant neoplasm of colon: Secondary | ICD-10-CM | POA: Diagnosis not present

## 2022-03-22 DIAGNOSIS — R110 Nausea: Secondary | ICD-10-CM

## 2022-04-20 DIAGNOSIS — K2930 Chronic superficial gastritis without bleeding: Secondary | ICD-10-CM | POA: Diagnosis not present

## 2022-04-20 DIAGNOSIS — R110 Nausea: Secondary | ICD-10-CM | POA: Diagnosis not present

## 2022-04-20 DIAGNOSIS — R6881 Early satiety: Secondary | ICD-10-CM | POA: Diagnosis not present

## 2022-04-22 ENCOUNTER — Ambulatory Visit
Admission: RE | Admit: 2022-04-22 | Discharge: 2022-04-22 | Disposition: A | Discharge status: Home / Self Care | Payer: BC Managed Care – PPO | Source: Ambulatory Visit | Attending: Gastroenterology | Admitting: Gastroenterology

## 2022-04-22 DIAGNOSIS — R109 Unspecified abdominal pain: Secondary | ICD-10-CM

## 2022-04-22 DIAGNOSIS — R110 Nausea: Secondary | ICD-10-CM

## 2022-05-07 ENCOUNTER — Ambulatory Visit
Admission: RE | Admit: 2022-05-07 | Discharge: 2022-05-07 | Disposition: A | Discharge status: Home / Self Care | Payer: BC Managed Care – PPO | Source: Ambulatory Visit | Attending: Gastroenterology | Admitting: Gastroenterology

## 2022-05-07 DIAGNOSIS — R110 Nausea: Secondary | ICD-10-CM | POA: Diagnosis not present

## 2022-05-07 DIAGNOSIS — R109 Unspecified abdominal pain: Secondary | ICD-10-CM | POA: Diagnosis not present

## 2022-06-09 ENCOUNTER — Telehealth: Payer: BC Managed Care – PPO | Admitting: Internal Medicine

## 2022-06-16 ENCOUNTER — Other Ambulatory Visit (HOSPITAL_COMMUNITY): Payer: Self-pay | Admitting: Gastroenterology

## 2022-06-16 DIAGNOSIS — R140 Abdominal distension (gaseous): Secondary | ICD-10-CM

## 2022-06-16 DIAGNOSIS — K3184 Gastroparesis: Secondary | ICD-10-CM

## 2022-06-30 ENCOUNTER — Ambulatory Visit (HOSPITAL_COMMUNITY)
Admission: RE | Admit: 2022-06-30 | Discharge: 2022-06-30 | Disposition: A | Discharge status: Home / Self Care | Payer: BC Managed Care – PPO | Source: Ambulatory Visit | Attending: Gastroenterology | Admitting: Gastroenterology

## 2022-06-30 DIAGNOSIS — K3184 Gastroparesis: Secondary | ICD-10-CM | POA: Insufficient documentation

## 2022-06-30 DIAGNOSIS — R140 Abdominal distension (gaseous): Secondary | ICD-10-CM | POA: Insufficient documentation

## 2022-06-30 DIAGNOSIS — R109 Unspecified abdominal pain: Secondary | ICD-10-CM | POA: Diagnosis not present

## 2022-06-30 MED ORDER — TECHNETIUM TC 99M SULFUR COLLOID
2.0000 | Freq: Once | INTRAVENOUS | Status: AC | PRN
  Administered 2022-06-30: 2 via INTRAVENOUS

## 2022-07-05 ENCOUNTER — Other Ambulatory Visit: Payer: Self-pay | Admitting: Gastroenterology

## 2022-07-05 DIAGNOSIS — R110 Nausea: Secondary | ICD-10-CM

## 2022-07-05 DIAGNOSIS — R109 Unspecified abdominal pain: Secondary | ICD-10-CM

## 2022-07-27 ENCOUNTER — Ambulatory Visit (INDEPENDENT_AMBULATORY_CARE_PROVIDER_SITE_OTHER): Payer: BC Managed Care – PPO | Admitting: Pulmonary Disease

## 2022-07-27 ENCOUNTER — Encounter (HOSPITAL_BASED_OUTPATIENT_CLINIC_OR_DEPARTMENT_OTHER): Payer: Self-pay | Admitting: Pulmonary Disease

## 2022-07-27 ENCOUNTER — Ambulatory Visit (HOSPITAL_BASED_OUTPATIENT_CLINIC_OR_DEPARTMENT_OTHER): Payer: BC Managed Care – PPO

## 2022-07-27 DIAGNOSIS — R053 Chronic cough: Secondary | ICD-10-CM | POA: Diagnosis not present

## 2022-07-27 DIAGNOSIS — R062 Wheezing: Secondary | ICD-10-CM | POA: Diagnosis not present

## 2022-07-27 DIAGNOSIS — M549 Dorsalgia, unspecified: Secondary | ICD-10-CM | POA: Diagnosis not present

## 2022-07-27 DIAGNOSIS — R059 Cough, unspecified: Secondary | ICD-10-CM | POA: Diagnosis not present

## 2022-07-27 MED ORDER — FLUTICASONE FUROATE-VILANTEROL 100-25 MCG/ACT IN AEPB: 1.0000 | INHALATION_SPRAY | Freq: Every day | RESPIRATORY_TRACT | 2 refills | Status: DC

## 2022-07-27 NOTE — Patient Instructions (Signed)
Chest xray and lab tests today  Will schedule pulmonary function test  Breo one puff daily, and rinse your mouth after each use  Follow up in 2 to 3 weeks with Dr. Wynona Neat or one of the nurse practitioners

## 2022-07-27 NOTE — Progress Notes (Signed)
[facility]  Name: Steven Hendricks MRN: [id number] DOB: [birth year]  Chief Complaint  Patient presents with   Back Pain    Had covid anout 1 month ago.  He has continue wheezing and cough.  Has persistent back pain.   Summary: 57 yo male cigar smoker seen in 2021 by Dr. Wynona Neat for shortness of breath., cough, and wheezing.  Subjective: He was lost to follow up after appointment in 2021.  He continued to have wheeze, and cough.  He had COVID about a month ago and his symptoms got worse.  This was his second episode of COVID.  He is now getting winded walking up stairs to the point he has to stop.  This wasn't an issue before.  He is worried he might be aspirating.  He was seen by GI.  He had cardiology assessment and was told his heart function is okay.  He gets brings up clear sputum.  He uses albuterol intermittent, and sometimes this helps.  He has not have fever or hemoptysis.  Weight has been steady.  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  Past medical history: He  has a past medical history of Asthma, Diverticulosis, Hemorrhoids, History of hernia repair, IBS (irritable bowel syndrome), and Ulnar nerve compression.  Vital signs: BP (!) 140/88   Pulse 71   Resp 11   Ht 6\' 1"  (1.854 m)   Wt 182 lb (82.6 kg)   SpO2 97%   BMI 24.01 kg/m   Physical exam:  General - alert ENT - no sinus tenderness, no stridor Cardiac - regular rate/rhythm, no murmur Chest - equal breath sounds b/l, no wheezing or rales Abdomen - soft, non tender, + bowel sounds Extremities - no cyanosis, clubbing, or edema Skin - no rashes Psych - normal mood and behavior Lymphatics - no lymphadenopathy  Assessment/plan:  Productive cough and wheezing. - could have asthma that got worse after recent COVID infection - he is concerned about having aspiration - will get CMET, CBC with diff, IgE, CXR, and PFT - will have him try breo 100 one puff daily - prn  albuterol   Patient Instructions  Chest xray and lab tests today  Will schedule pulmonary function test  Breo one puff daily, and rinse your mouth after each use  Follow up in 2 to 3 weeks with Dr. Wynona Neat or one of the nurse practitioners  Allergies as of 07/27/2022   No Known Allergies      Medication List        Accurate as of July 27, 2022  4:01 PM. If you have any questions, ask your nurse or doctor.          STOP taking these medications    rosuvastatin 20 MG tablet Commonly known as: CRESTOR Stopped by: Coralyn Helling       TAKE these medications    albuterol 108 (90 Base) MCG/ACT inhaler Commonly known as: VENTOLIN HFA Inhale 2 puffs into the lungs every 4 (four) hours as needed.   alfuzosin 10 MG 24 hr tablet Commonly known as: UROXATRAL Take 1 tablet by mouth at bedtime.   cholecalciferol 25 MCG (1000 UNIT) tablet Commonly known as: VITAMIN D3 Take 1,000 Units by mouth daily.   EpiPen 2-Pak 0.3 mg/0.3 mL Soaj injection Generic drug: EPINEPHrine as directed Injection as needed for anaphylaxis for 30 days   Fish Oil 300 MG Caps Take 1 capsule by mouth daily.   fluticasone furoate-vilanterol  100-25 MCG/ACT Aepb Commonly known as: BREO ELLIPTA Inhale 1 puff into the lungs daily. Started by: Coralyn Helling   Glucosamine 500 MG Caps 2 capsule Orally daily for 30 day(s)   magnesium 30 MG tablet Take 30 mg by mouth daily.   milk thistle 175 MG tablet Take 175 mg by mouth daily.   Multi-Vitamin tablet Take 1 tablet by mouth daily.   Osteo Bi-Flex Adv Joint Shield Tabs Take 1 tablet by mouth daily.   QC TUMERIC COMPLEX PO Take 1 tablet by mouth daily.   vitamin C 100 MG tablet Take 100 mg by mouth daily.       Time spent: 36 minutes  Signature: Coralyn Helling, MD [facility] Pulmonary/Critical Care Pager - [phone] 07/27/2022, 4:01 PM

## 2022-07-28 ENCOUNTER — Ambulatory Visit: Admission: RE | Admit: 2022-07-28 | Payer: BC Managed Care – PPO | Source: Ambulatory Visit

## 2022-07-28 DIAGNOSIS — R109 Unspecified abdominal pain: Secondary | ICD-10-CM

## 2022-07-28 DIAGNOSIS — K5730 Diverticulosis of large intestine without perforation or abscess without bleeding: Secondary | ICD-10-CM | POA: Diagnosis not present

## 2022-07-28 DIAGNOSIS — R110 Nausea: Secondary | ICD-10-CM | POA: Diagnosis not present

## 2022-07-28 DIAGNOSIS — N400 Enlarged prostate without lower urinary tract symptoms: Secondary | ICD-10-CM | POA: Diagnosis not present

## 2022-07-28 MED ORDER — IOPAMIDOL (ISOVUE-300) INJECTION 61%
100.0000 mL | Freq: Once | INTRAVENOUS | Status: AC | PRN
  Administered 2022-07-28: 100 mL via INTRAVENOUS

## 2022-08-06 DIAGNOSIS — R110 Nausea: Secondary | ICD-10-CM | POA: Diagnosis not present

## 2022-08-27 ENCOUNTER — Telehealth: Payer: Self-pay | Admitting: Pulmonary Disease

## 2022-08-27 ENCOUNTER — Ambulatory Visit: Payer: BC Managed Care – PPO | Admitting: Primary Care

## 2022-08-27 NOTE — Telephone Encounter (Signed)
PT presents to the front. States he was give a 30 day supply of Breo and he needs a new script.  [facility]arm is [facility] [address]  (He was given only a 30 day supply and can not get a follow up appt soon enough to see Dr. For a refill.)  Call [phone]

## 2022-08-27 NOTE — Telephone Encounter (Signed)
Also, and more important is is causing laryngitis can he be issued an alternative.

## 2022-09-01 NOTE — Telephone Encounter (Signed)
Sending message to Dr. Craige Cotta

## 2022-09-01 NOTE — Telephone Encounter (Signed)
Likely having difficulty tolerating DPI.  Please change to symbicort 80 two puffs bid.

## 2022-09-02 NOTE — Telephone Encounter (Signed)
Please call in the new RX. Thanks.

## 2022-09-03 ENCOUNTER — Other Ambulatory Visit: Payer: Self-pay | Admitting: Internal Medicine

## 2022-09-03 DIAGNOSIS — E785 Hyperlipidemia, unspecified: Secondary | ICD-10-CM

## 2022-09-03 MED ORDER — BUDESONIDE-FORMOTEROL FUMARATE 80-4.5 MCG/ACT IN AERO: 2.0000 | INHALATION_SPRAY | Freq: Two times a day (BID) | RESPIRATORY_TRACT | 3 refills | Status: DC

## 2022-09-03 NOTE — Telephone Encounter (Signed)
Symbicort rx sent to pharmacy.

## 2022-09-09 DIAGNOSIS — E785 Hyperlipidemia, unspecified: Secondary | ICD-10-CM | POA: Diagnosis not present

## 2022-09-10 LAB — NMR, LIPOPROFILE
Cholesterol, Total: 222 mg/dL — ABNORMAL HIGH (ref 100–199)
HDL Particle Number: 30.9 umol/L (ref >=30.5)
HDL-C: 41 mg/dL (ref >39)
LDL Particle Number: 2115 nmol/L — ABNORMAL HIGH (ref <1000)
LDL Size: 20.2 nm — ABNORMAL LOW (ref >20.5)
LDL-C (NIH Calc): 142 mg/dL — ABNORMAL HIGH (ref 0–99)
LP-IR Score: 79 — ABNORMAL HIGH (ref <=45)
Small LDL Particle Number: 1112 nmol/L — ABNORMAL HIGH (ref <=527)
Triglycerides: 216 mg/dL — ABNORMAL HIGH (ref 0–149)

## 2022-09-10 LAB — LIPOPROTEIN A (LPA): Lipoprotein (a): 17.7 nmol/L (ref <75.0)

## 2022-09-14 ENCOUNTER — Encounter: Payer: Self-pay | Admitting: Internal Medicine

## 2022-09-16 ENCOUNTER — Encounter: Payer: Self-pay | Admitting: Internal Medicine

## 2022-09-16 ENCOUNTER — Ambulatory Visit: Payer: BC Managed Care – PPO | Attending: Internal Medicine | Admitting: Internal Medicine

## 2022-09-16 VITALS — Ht 72.0 in | Wt 175.0 lb

## 2022-09-16 DIAGNOSIS — E785 Hyperlipidemia, unspecified: Secondary | ICD-10-CM | POA: Diagnosis not present

## 2022-09-16 DIAGNOSIS — I2510 Atherosclerotic heart disease of native coronary artery without angina pectoris: Secondary | ICD-10-CM

## 2022-09-16 DIAGNOSIS — T466X5A Adverse effect of antihyperlipidemic and antiarteriosclerotic drugs, initial encounter: Secondary | ICD-10-CM | POA: Diagnosis not present

## 2022-09-16 DIAGNOSIS — M791 Myalgia, unspecified site: Secondary | ICD-10-CM | POA: Diagnosis not present

## 2022-09-16 DIAGNOSIS — I2584 Coronary atherosclerosis due to calcified coronary lesion: Secondary | ICD-10-CM

## 2022-09-16 NOTE — Patient Instructions (Signed)
Medication Instructions:  NO CHANGES  *If you need a refill on your cardiac medications before your next appointment, please call your pharmacy*   Lab Work: FASTING labs in 6 months - NMR lipoprofile - HS-CRP  If you have labs (blood work) drawn today and your tests are completely normal, you will receive your results only by: MyChart Message (if you have MyChart) OR A paper copy in the mail If you have any lab test that is abnormal or we need to change your treatment, we will call you to review the results.   Follow-Up: At [facility], you and your health needs are our priority.  As part of our continuing mission to provide you with exceptional heart care, we have created designated Provider Care Teams.  These Care Teams include your primary Cardiologist (physician) and Advanced Practice Providers (APPs -  Physician Assistants and Nurse Practitioners) who all work together to provide you with the care you need, when you need it.  We recommend signing up for the patient portal called "MyChart".  Sign up information is provided on this After Visit Summary.  MyChart is used to connect with patients for Virtual Visits (Telemedicine).  Patients are able to view lab/test results, encounter notes, upcoming appointments, etc.  Non-urgent messages can be sent to your provider as well.   To learn more about what you can do with MyChart, go to [email or web address].    Your next appointment:   6 month(s)  Provider:   Zoila Shutter MD - lipid clinic

## 2022-09-16 NOTE — Progress Notes (Signed)
Virtual Visit via Video Note   Because of Steven Hendricks co-morbid illnesses, he is at least at moderate risk for complications without adequate follow up.  This format is felt to be most appropriate for this patient at this time.  All issues noted in this document were discussed and addressed.  A limited physical exam was performed with this format.  Please refer to the patient's chart for his consent to telehealth for [facility].      Date:  09/16/2022   ID:  Zoe Lan, DOB 08-07-1965, MRN [id number] The patient was identified using 2 identifiers.  Evaluation Performed:  New Patient Evaluation  Patient Location:  [address] [place] [address]  Provider location:   [address], [address] [place], [place] [address]  PCP:  Elias Else, MD (Inactive)  Cardiologist:  None Electrophysiologist:  None   Chief Complaint: Follow-up dyslipidemia  History of Present Illness:    Steven Hendricks is a 57 y.o. male who presents via audio/video conferencing for a telehealth visit today.  This is a pleasant 57 year old male kindly referred for evaluation management of dyslipidemia.  This has been a longstanding issue for him.  He says he had previously tried a statin but stopped taking it because he has general concerns about taking medications.  He wanted to try to manage it with diet and exercise.  He has no known cardiovascular disease.  He had prior imaging of his heart including an MRI back in 2007 which showed normal structure.  He has had CTs of the chest back in 2014 and 2015 which showed no coronary calcification.  He had carotid Dopplers which showed minimal to no carotid atherosclerosis in 2018.  Despite this his cholesterol has remained high with a mixed dyslipidemia and high LDL particle number, small LDL particle numbers, and low HDL cholesterol.  His last lipid profile was in May of this year.  This demonstrated LDL particle number of 2215,  total cholesterol 228, LDL-C164, HDL-C of 37, triglycerides 146, and small LDL particle #1359.  He reports fairly healthy diet low in saturated fats.  He is generally active and not overweight.  He had a former smoking history, mostly cigars but quit a number of years ago.  He was noted to have a lung nodule but it was favored to be benign on imaging about 8 years ago.  He does not know his mother's side of the family but notes his father did not have any early onset heart disease or high cholesterol.  He had previously been offered ezetimibe as well and fenofibrate however has declined and was more interested in the lipid clinic evaluation.  03/01/2022  Steven Hendricks returns today for follow-up of dyslipidemia.  He underwent calcium scoring in November.  This demonstrated aortic atherosclerosis and small bilateral pulmonary nodules which were considered benign and present since 2013.  He did have some coronary calcification in the LAD with a score of 0.72, 7th percentile for age and sex matched controls.  A repeat lipid profile showed an LDL particle number of 2201 with an LDL-C of 165, HDL-C of 40, triglycerides 164 and small LDL P of 1416.  LP(a) was ordered but not performed.  09/16/2022  Steven Hendricks is seen today in follow-up.  He reports he could not tolerate the statin.  He tried it for a few days and had some side effects and concerns about it.  Overall it sounds that he is generally not interested in  statin therapy.  We discussed some possible statin alternatives including ezetimibe or bempedoic acid.  I do not think he would likely qualify for PCSK9 inhibitor.  Fortunately his LP(a) was negative and he does have some coronary artery disease although less than expected for age.  We also discussed methods to survey this.  It is typically not recommended to do serial calcium scores or repeat serial imaging for asymptomatic patients.  LP(a) does not need to be reassessed since is genetically determined and  does not change during once lifetime without therapy.  Prior CV studies:   The following studies were reviewed today:  Chart reviewed, lab work  PMHx:  Past Medical History:  Diagnosis Date   Asthma    Diverticulosis    Hemorrhoids    History of hernia repair    right   IBS (irritable bowel syndrome)    Ulnar nerve compression     Past Surgical History:  Procedure Laterality Date   COLONOSCOPY     HERNIA REPAIR      FAMHx:  Family History  Problem Relation Age of Onset   Irritable bowel syndrome Sister    Crohn's disease Sister     SOCHx:   reports that he has been smoking cigars. He has never used smokeless tobacco. He reports current alcohol use. He reports that he does not use drugs.  ALLERGIES:  No Known Allergies  MEDS:  Current Meds  Medication Sig   albuterol (VENTOLIN HFA) 108 (90 Base) MCG/ACT inhaler Inhale 2 puffs into the lungs every 4 (four) hours as needed.   alfuzosin (UROXATRAL) 10 MG 24 hr tablet Take 1 tablet by mouth at bedtime.   Ascorbic Acid (VITAMIN C) 100 MG tablet Take 100 mg by mouth daily.   budesonide-formoterol (SYMBICORT) 80-4.5 MCG/ACT inhaler Inhale 2 puffs into the lungs 2 (two) times daily.   cholecalciferol (VITAMIN D3) 25 MCG (1000 UNIT) tablet Take 1,000 Units by mouth daily.   EPINEPHrine (EPIPEN 2-PAK) 0.3 mg/0.3 mL IJ SOAJ injection as directed Injection as needed for anaphylaxis for 30 days   fluticasone furoate-vilanterol (BREO ELLIPTA) 100-25 MCG/ACT AEPB Inhale 1 puff into the lungs daily.   Glucosamine 500 MG CAPS 2 capsule Orally daily for 30 day(s)   magnesium 30 MG tablet Take 30 mg by mouth daily.   milk thistle 175 MG tablet Take 175 mg by mouth daily.   Misc Natural Products (OSTEO BI-FLEX ADV JOINT SHIELD) TABS Take 1 tablet by mouth daily.   Multiple Vitamin (MULTI-VITAMIN) tablet Take 1 tablet by mouth daily.   Omega-3 Fatty Acids (FISH OIL) 300 MG CAPS Take 1 capsule by mouth daily.   Turmeric (QC TUMERIC  COMPLEX PO) Take 1 tablet by mouth daily.     ROS: Pertinent items noted in HPI and remainder of comprehensive ROS otherwise negative.  Labs/Other Tests and Data Reviewed:    Recent Labs: 07/27/2022: ALT 13; BUN 17; Creatinine, Ser 1.11; Hemoglobin 14.8; Platelets 175; Potassium 4.3; Sodium 143   Recent Lipid Panel No results found for: "CHOL", "TRIG", "HDL", "CHOLHDL", "LDLCALC", "LDLDIRECT"  Wt Readings from Last 3 Encounters:  09/16/22 175 lb (79.4 kg)  07/27/22 182 lb (82.6 kg)  03/01/22 175 lb (79.4 kg)     Exam:    Vital Signs:  Ht 6' (1.829 m)   Wt 175 lb (79.4 kg)   BMI 23.73 kg/m    General appearance: alert and no distress Lungs: No wheezes Abdomen: Normal weight Extremities: extremities normal, atraumatic, no cyanosis  or edema Neurologic: Grossly normal  ASSESSMENT & PLAN:    Mixed dyslipidemia, goal LDL less than 70 CAC score of 0.72, 37th percentile (11/2021) Negative LP(a) Statin intolerant-myalgias  Steven Hendricks is not tolerant to statins and seems more interested in monitoring his coronary artery disease rather than proactively treating it.  He is doing a lot with diet and exercise however I suspect genetics he is keeping his cholesterol high.  He might benefit from ezetimibe or Nexletol's other nonstatin options.  He wishes to investigate those further.  Will plan to repeat lipid in 6 months and follow-up at that time.  Patient Risk:   After full review of this patients clinical status, I feel that they are at least moderate risk at this time.  Time:   Today, I have spent 25 minutes with the patient with telehealth technology discussing dyslipidemia.     Medication Adjustments/Labs and Tests Ordered: Current medicines are reviewed at length with the patient today.  Concerns regarding medicines are outlined above.   Tests Ordered: Orders Placed This Encounter  Procedures   NMR, lipoprofile   High sensitivity CRP    Medication Changes: No  orders of the defined types were placed in this encounter.   Disposition:  in 6 month(s)  Chrystie Nose, MD, [facility], FACP  [place]  [facility]  Medical Director of the Advanced Lipid Disorders &  Cardiovascular Risk Reduction Clinic Diplomate of the American Board of Clinical Lipidology Attending Cardiologist  Direct Dial: [phone]  Fax: [phone]  Website:  [email or web address]  Chrystie Nose, MD  09/16/2022 10:58 AM

## 2022-10-08 ENCOUNTER — Other Ambulatory Visit: Payer: Self-pay | Admitting: Gastroenterology

## 2022-10-08 DIAGNOSIS — R059 Cough, unspecified: Secondary | ICD-10-CM

## 2022-10-08 DIAGNOSIS — R1110 Vomiting, unspecified: Secondary | ICD-10-CM

## 2022-10-13 ENCOUNTER — Telehealth: Payer: Self-pay | Admitting: Primary Care

## 2022-10-13 NOTE — Telephone Encounter (Signed)
Patient  needs refill on Breo Ellipta inhaler.  Pharmacy [facility] [address] [facility] [address]

## 2022-10-15 ENCOUNTER — Other Ambulatory Visit (HOSPITAL_BASED_OUTPATIENT_CLINIC_OR_DEPARTMENT_OTHER): Payer: Self-pay

## 2022-10-15 MED ORDER — FLUTICASONE FUROATE-VILANTEROL 100-25 MCG/ACT IN AEPB: 1.0000 | INHALATION_SPRAY | Freq: Every day | RESPIRATORY_TRACT | 2 refills | Status: DC

## 2022-10-15 NOTE — Telephone Encounter (Signed)
Refill sent.

## 2022-11-04 ENCOUNTER — Ambulatory Visit
Admission: RE | Admit: 2022-11-04 | Discharge: 2022-11-04 | Disposition: A | Discharge status: Home / Self Care | Payer: BC Managed Care – PPO | Source: Ambulatory Visit | Attending: Gastroenterology | Admitting: Gastroenterology

## 2022-11-04 DIAGNOSIS — R1310 Dysphagia, unspecified: Secondary | ICD-10-CM | POA: Diagnosis not present

## 2022-11-04 DIAGNOSIS — R059 Cough, unspecified: Secondary | ICD-10-CM

## 2022-11-04 DIAGNOSIS — R1110 Vomiting, unspecified: Secondary | ICD-10-CM

## 2022-11-30 ENCOUNTER — Ambulatory Visit (HOSPITAL_BASED_OUTPATIENT_CLINIC_OR_DEPARTMENT_OTHER): Payer: BC Managed Care – PPO | Admitting: Pulmonary Disease

## 2022-11-30 ENCOUNTER — Ambulatory Visit (INDEPENDENT_AMBULATORY_CARE_PROVIDER_SITE_OTHER): Payer: BC Managed Care – PPO | Admitting: Primary Care

## 2022-11-30 ENCOUNTER — Encounter: Payer: Self-pay | Admitting: Primary Care

## 2022-11-30 VITALS — BP 120/79 | HR 69 | Temp 98.0°F | Ht 72.0 in | Wt 186.6 lb

## 2022-11-30 DIAGNOSIS — J4530 Mild persistent asthma, uncomplicated: Secondary | ICD-10-CM | POA: Diagnosis not present

## 2022-11-30 DIAGNOSIS — R062 Wheezing: Secondary | ICD-10-CM

## 2022-11-30 DIAGNOSIS — K219 Gastro-esophageal reflux disease without esophagitis: Secondary | ICD-10-CM | POA: Insufficient documentation

## 2022-11-30 DIAGNOSIS — R053 Chronic cough: Secondary | ICD-10-CM

## 2022-11-30 LAB — PULMONARY FUNCTION TEST
DL/VA % pred: 103 %
DL/VA: 4.39 ml/min/mmHg/L
DLCO cor % pred: 121 %
DLCO cor: 35.48 ml/min/mmHg
DLCO unc % pred: 121 %
DLCO unc: 35.48 ml/min/mmHg
FEF 25-75 Post: 5.12 L/s
FEF 25-75 Pre: 3.93 L/s
FEF2575-%Change-Post: 30 %
FEF2575-%Pred-Post: 159 %
FEF2575-%Pred-Pre: 122 %
FEV1-%Change-Post: 8 %
FEV1-%Pred-Post: 126 %
FEV1-%Pred-Pre: 116 %
FEV1-Post: 4.9 L
FEV1-Pre: 4.5 L
FEV1FVC-%Change-Post: 7 %
FEV1FVC-%Pred-Pre: 98 %
FEV6-%Change-Post: 2 %
FEV6-%Pred-Post: 124 %
FEV6-%Pred-Pre: 121 %
FEV6-Post: 6.07 L
FEV6-Pre: 5.9 L
FEV6FVC-%Change-Post: 1 %
FEV6FVC-%Pred-Post: 104 %
FEV6FVC-%Pred-Pre: 103 %
FVC-%Change-Post: 1 %
FVC-%Pred-Post: 119 %
FVC-%Pred-Pre: 117 %
FVC-Post: 6.07 L
FVC-Pre: 5.98 L
Post FEV1/FVC ratio: 81 %
Post FEV6/FVC ratio: 100 %
Pre FEV1/FVC ratio: 75 %
Pre FEV6/FVC Ratio: 99 %
RV % pred: 116 %
RV: 2.66 L
TLC % pred: 119 %
TLC: 8.75 L

## 2022-11-30 MED ORDER — AIRSUPRA 90-80 MCG/ACT IN AERO: 2.0000 | INHALATION_SPRAY | RESPIRATORY_TRACT | 2 refills | Status: DC | PRN

## 2022-11-30 NOTE — Patient Instructions (Signed)
Full PFT Performed Today  

## 2022-11-30 NOTE — Progress Notes (Signed)
@Patient  ID: Steven Hendricks, male    DOB: 1965/04/28, 57 y.o.   MRN: [id number]  Chief Complaint  Patient presents with   Follow-up    Referring provider: No ref. provider found  HPI: 57 yo male, cigar smoker. PMH significant for asthma, diverticulosis, IBS, hernia repair, ulnar nerve compression.  Previous [facility] pulmonary encounter: 07/27/22- Dr. Craige Cotta He was lost to follow up after appointment in 2021.  He continued to have wheeze, and cough.  He had COVID about a month ago and his symptoms got worse.  This was his second episode of COVID.  He is now getting winded walking up stairs to the point he has to stop.  This wasn't an issue before.  He is worried he might be aspirating.  He was seen by GI.  He had cardiology assessment and was told his heart function is okay.  He gets brings up clear sputum.  He uses albuterol intermittent, and sometimes this helps.  He has not have fever or hemoptysis.  Weight has been steady.  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  11/30/2022 Discussed the use of AI scribe software for clinical note transcription with the patient, who gave verbal consent to proceed.  Patient presents today for 64-month follow-up.  Former patient of Dr. Craige Cotta, last seen in office on 07/27/2022 due to cough/wheezing symptoms.  Felt patient could have asthma which could have worsened after recent COVID infection.  He was ordered for labs, chest x-ray and pulmonary function testing.  He was given a trial of Breo 100 mcg 1 puff daily and as needed albuterol. Chest x-ray on 07/27/2022 showed hyperinflated lungs.  Eosinophil absolute 100, IgE 110. Pulmonary function testing today showed evidence of air trapping/hyperinflation.  History of Present Illness   The patient, with a history of presumed asthma, presented for a follow-up visit after a consultation in July. The initial consultation was for back pain, which was determined to be non-muscular in nature. The patient also reported a  history of COVID-19 infection in early June.  The patient described experiencing shortness of breath and wheezing, particularly when climbing stairs, a routine activity that had become increasingly difficult over time. The patient denied any changes in lifestyle or activity level that could account for this change. The patient also reported a rapid progression of these symptoms, necessitating rest after climbing four flights of stairs.  The patient was previously evaluated by cardiology and cleared of any cardiac issues. The patient was diagnosed with presumed asthma and was prescribed Breo. The patient reported that the inhaler helped with the lung pain but did not completely alleviate it. The patient has not been on the daily inhaler for approximately three to four weeks. The patient also reported a history of using Symbicort in the past. The patient's chest x-ray in July showed evidence of hyperinflation, and the patient's total lung capacity was found to be 119% on a recent breathing test, indicating air trapping.  The patient also reported a recent increase in asthmatic symptoms and expressed concern about pain in the lungs. The patient has been using a rescue inhaler frequently in the past month.  The patient also reported a history of dust mite allergy, which was found to be very high on an allergy test conducted in 2021. The patient reported using a HEPA filter at home and expressed a preference for managing symptoms with minimal medication. The patient also reported a practice of intentional breathing, specifically "dragon breath" breathing, to  manage wheezing symptoms.  The patient also reported experiencing wheezing after eating, raising concerns about possible aspiration. The patient reported no significant allergies to grass or tree pollen. The patient expressed a desire to manage symptoms without medication, if possible, and to understand more about the condition of hyperinflation of the  lungs.       Pulmonary function testing: PFTs 11/30/22 >> FVC 6.07 (119%), FEV1 4.90 (126%), ratio 81, TLC 119%  No Known Allergies  Immunization History  Administered Date(s) Administered   Janssen (J&J) SARS-COV-2 Vaccination 06/12/2019, 09/01/2020    Past Medical History:  Diagnosis Date   Asthma    Diverticulosis    Hemorrhoids    History of hernia repair    right   IBS (irritable bowel syndrome)    Ulnar nerve compression     Tobacco History: Social History   Tobacco Use  Smoking Status Some Days   Types: Cigars  Smokeless Tobacco Never  Tobacco Comments   2 or 3 a year   Ready to quit: Not Answered Counseling given: Not Answered Tobacco comments: 2 or 3 a year   Outpatient Medications Prior to Visit  Medication Sig Dispense Refill   albuterol (VENTOLIN HFA) 108 (90 Base) MCG/ACT inhaler Inhale 2 puffs into the lungs every 4 (four) hours as needed.     alfuzosin (UROXATRAL) 10 MG 24 hr tablet Take 1 tablet by mouth at bedtime.     Ascorbic Acid (VITAMIN C) 100 MG tablet Take 100 mg by mouth daily.     budesonide-formoterol (SYMBICORT) 80-4.5 MCG/ACT inhaler Inhale 2 puffs into the lungs 2 (two) times daily. 10.2 g 3   cholecalciferol (VITAMIN D3) 25 MCG (1000 UNIT) tablet Take 1,000 Units by mouth daily.     EPINEPHrine (EPIPEN 2-PAK) 0.3 mg/0.3 mL IJ SOAJ injection as directed Injection as needed for anaphylaxis for 30 days     fluticasone furoate-vilanterol (BREO ELLIPTA) 100-25 MCG/ACT AEPB Inhale 1 puff into the lungs daily. 60 each 2   Glucosamine 500 MG CAPS 2 capsule Orally daily for 30 day(s)     magnesium 30 MG tablet Take 30 mg by mouth daily.     milk thistle 175 MG tablet Take 175 mg by mouth daily.     Misc Natural Products (OSTEO BI-FLEX ADV JOINT SHIELD) TABS Take 1 tablet by mouth daily.     Multiple Vitamin (MULTI-VITAMIN) tablet Take 1 tablet by mouth daily.     Omega-3 Fatty Acids (FISH OIL) 300 MG CAPS Take 1 capsule by mouth daily.      Turmeric (QC TUMERIC COMPLEX PO) Take 1 tablet by mouth daily.     No facility-administered medications prior to visit.    Review of Systems  Review of Systems  Constitutional: Negative.   HENT: Negative.    Respiratory: Negative.    Cardiovascular: Negative.   Psychiatric/Behavioral: Negative.     Physical Exam  BP 120/79 (BP Location: Left Arm, Patient Position: Sitting, Cuff Size: Normal)   Pulse 69   Temp 98 F (36.7 C) (Oral)   Ht 6' (1.829 m)   Wt 186 lb 9.6 oz (84.6 kg)   SpO2 96%   BMI 25.31 kg/m  Physical Exam Constitutional:      Appearance: Normal appearance.  HENT:     Head: Normocephalic and atraumatic.  Cardiovascular:     Rate and Rhythm: Normal rate and regular rhythm.  Pulmonary:     Effort: Pulmonary effort is normal.     Breath sounds: Normal  breath sounds. No wheezing or rhonchi.  Musculoskeletal:        General: Normal range of motion.  Skin:    General: Skin is warm and dry.  Neurological:     General: No focal deficit present.     Mental Status: He is alert and oriented to person, place, and time. Mental status is at baseline.  Psychiatric:        Mood and Affect: Mood normal.        Behavior: Behavior normal.        Thought Content: Thought content normal.        Judgment: Judgment normal.      Lab Results:  CBC    Component Value Date/Time   WBC 7.6 07/27/2022 1609   WBC 7.0 02/22/2020 1530   RBC 4.66 07/27/2022 1609   RBC 5.37 02/22/2020 1530   HGB 14.8 07/27/2022 1609   HCT 42.9 07/27/2022 1609   PLT 175 07/27/2022 1609   MCV 92 07/27/2022 1609   MCH 31.8 07/27/2022 1609   MCH 31.3 02/22/2020 1530   MCHC 34.5 07/27/2022 1609   MCHC 34.9 02/22/2020 1530   RDW 13.2 07/27/2022 1609   LYMPHSABS 2.4 07/27/2022 1609   MONOABS 1.6 (H) 02/22/2020 1530   EOSABS 0.1 07/27/2022 1609   BASOSABS 0.1 07/27/2022 1609    BMET    Component Value Date/Time   NA 143 07/27/2022 1609   K 4.3 07/27/2022 1609   CL 104 07/27/2022 1609    CO2 26 07/27/2022 1609   GLUCOSE 82 07/27/2022 1609   GLUCOSE 116 (H) 02/22/2020 1530   BUN 17 07/27/2022 1609   CREATININE 1.11 07/27/2022 1609   CALCIUM 9.4 07/27/2022 1609   GFRNONAA >60 02/22/2020 1530    BNP No results found for: "BNP"  ProBNP No results found for: "PROBNP"  Imaging: DG ESOPHAGUS W DOUBLE CM (HD)  Result Date: 11/04/2022 CLINICAL DATA:  Provided history: Cough, unspecified type. Regurgitation and rechewing. Additional history provided: The patient reports difficulty swallowing food and liquids, regurgitation of solids. EXAM: ESOPHOGRAM/BARIUM SWALLOW TECHNIQUE: A combined double contrast and single contrast examination was performed using effervescent crystals, thick barium liquid, and thin barium liquid. The patient also swallowed a 13 mm barium tablet under fluoroscopy. FLUOROSCOPY: Radiation Exposure Index (as provided by the fluoroscopic device): 27.0 mGy Kerma COMPARISON:  CT abdomen/pelvis 07/28/2022. Upper GI series 03/21/2003. FINDINGS: Fluoroscopic evaluation demonstrates normal caliber and smooth contour of the esophagus. No evidence of fixed stricture, mass or mucosal abnormality. Normal esophageal motility was observed. No appreciable hiatal hernia. No gastroesophageal reflux was observed. The patient swallowed a 13 mm barium tablet, which freely passed into the stomach. IMPRESSION: Unremarkable esophagram as described. Electronically Signed   By: Jackey Loge D.O.   On: 11/04/2022 11:41     Assessment & Plan:   1. Mild persistent asthma without complication (Primary)     Asthma Persistent symptoms of wheezing and shortness of breath, with evidence of hyperinflation on chest x-ray and pulmonary function tests. Previous use of Breo provided some relief but symptoms have returned. High allergy to dust mites noted on previous allergy testing. -Discontinue Breo and switch to Airsupra (budesonide and albuterol) as needed. -Consider daily over-the-counter  antihistamine (Claritin, Zyrtec, or Xyzal) for dust mite allergy. -Continue environmental control measures (HEPA filter, regular vacuuming, changing linens). -If rescue inhaler needed daily, consider returning to a maintenance long acting ICS/LABA inhaler. -Contact provider if rescue inhaler needed multiple times a day.  Possible Gastroesophageal Reflux  Disease (GERD) Reports of wheezing after eating, which could be due to reflux-induced asthma symptoms. -Consider dietary modifications to reduce reflux symptoms. -Follow-up with ENT specialist for further evaluation.  General Health Maintenance -Stay up-to-date with vaccines. -Continue monitoring for any potential memory issues related to Singulair use.      Glenford Bayley, NP 02/02/2023

## 2022-11-30 NOTE — Patient Instructions (Addendum)
-ASTHMA: Asthma is a condition where your airways narrow and swell, producing extra mucus, which can make breathing difficult. We will discontinue Breo and switch to Airsupra (budesonide and albuterol) as needed. Consider taking a daily over-the-counter antihistamine like Claritin, Zyrtec, or Xyzal for your dust mite allergy. Continue using your HEPA filter, vacuum regularly, and change your linens often. If you need your rescue inhaler daily, we may need to return to a maintenance inhaler. Contact us if you need the rescue inhaler multiple times a day.  -POSSIBLE GASTROESOPHAGEAL REFLUX DISEASE (GERD): GERD is a condition where stomach acid frequently flows back into the tube connecting your mouth and stomach, which can cause wheezing after eating. Consider making dietary changes to reduce reflux symptoms and follow up with an ENT specialist for further evaluation.  -GENERAL HEALTH MAINTENANCE: Stay up-to-date with your vaccines   Follow-up 6 months with Dr. Wynona Neat (former Dr. Craige Cotta patient)   Asthma, Adult  Asthma is a condition that causes swelling and narrowing of the airways. These are the passages that lead from the nose and mouth down into the lungs. When asthma symptoms get worse it is called an asthma attack or flare. This can make it hard to breathe. Asthma flares can range from minor to life-threatening. There is no cure for asthma, but medicines and lifestyle changes can help to control it. What are the causes? It is not known exactly what causes asthma, but certain things can cause asthma symptoms to get worse (triggers). What can trigger an asthma attack? Cigarette smoke. Mold. Dust. Your pet's skin flakes (dander). Cockroaches. Pollen. Air pollution (like household cleaners, wood smoke, smog, or Therapist, occupational). What are the signs or symptoms? Trouble breathing (shortness of breath). Coughing. Making high-pitched whistling sounds when you breathe, most often when you breathe  out (wheezing). Chest tightness. Tiredness with little activity. Poor exercise tolerance. How is this treated? Controller medicines that help prevent asthma symptoms. Fast-acting reliever or rescue medicines. These give short-term relief of asthma symptoms. Allergy medicines if your attacks are brought on by allergens. Medicines to help control the body's defense (immune) system. Staying away from the things that cause asthma attacks. Follow these instructions at home: Avoiding triggers in your home Do not allow anyone to smoke in your home. Limit use of fireplaces and wood stoves. Get rid of pests (such as roaches and mice) and their droppings. Keep your home clean. Clean your floors. Dust regularly. Use cleaning products that do not smell. Wash bed sheets and blankets every week in hot water. Dry them in a dryer. Have someone vacuum when you are not home. Change your heating and air conditioning filters often. Use blankets that are made of polyester or cotton. General instructions Take over-the-counter and prescription medicines only as told by your doctor. Do not smoke or use any products that contain nicotine or tobacco. If you need help quitting, ask your doctor. Stay away from secondhand smoke. Avoid doing things outdoors when allergen counts are high and when air quality is low. Warm up before you exercise. Take time to cool down after exercise. Use a peak flow meter as told by your doctor. A peak flow meter is a tool that measures how well your lungs are working. Keep track of the peak flow meter's readings. Write them down. Follow your asthma action plan. This is a written plan for taking care of your asthma and treating your attacks. Make sure you get all the shots (vaccines) that your doctor recommends. Ask your  doctor about a flu shot and a pneumonia shot. Keep all follow-up visits. Contact a doctor if: You have wheezing, shortness of breath, or a cough even while taking  medicine to prevent attacks. The mucus you cough up (sputum) is thicker than usual. The mucus you cough up changes from clear or white to yellow, green, gray, or is bloody. You have problems from the medicine you are taking, such as: A rash. Itching. Swelling. Trouble breathing. You need reliever medicines more than 2-3 times a week. Your peak flow reading is still at 50-79% of your personal best after following the action plan for 1 hour. You have a fever. Get help right away if: You seem to be worse and are not responding to medicine during an asthma attack. You are short of breath even at rest. You get short of breath when doing very little activity. You have trouble eating, drinking, or talking. You have chest pain or tightness. You have a fast heartbeat. Your lips or fingernails start to turn blue. You are light-headed or dizzy, or you faint. Your peak flow is less than 50% of your personal best. You feel too tired to breathe normally. These symptoms may be an emergency. Get help right away. Call 911. Do not wait to see if the symptoms will go away. Do not drive yourself to the hospital. Summary Asthma is a long-term (chronic) condition in which the airways get tight and narrow. An asthma attack can make it hard to breathe. Asthma cannot be cured, but medicines and lifestyle changes can help control it. Make sure you understand how to avoid triggers and how and when to use your medicines. Avoid things that can cause allergy symptoms (allergens). These include animal skin flakes (dander) and pollen from trees or grass. Avoid things that pollute the air. These may include household cleaners, wood smoke, smog, or chemical odors. This information is not intended to replace advice given to you by your health care provider. Make sure you discuss any questions you have with your health care provider. Document Revised: 09/29/2020 Document Reviewed: 09/29/2020 Elsevier Patient Education   2024 [employer]  Food Choices for Gastroesophageal Reflux Disease, Adult When you have gastroesophageal reflux disease (GERD), the foods you eat and your eating habits are very important. Choosing the right foods can help ease the discomfort of GERD. Consider working with a dietitian to help you make healthy food choices. What are tips for following this plan? Reading food labels Look for foods that are low in saturated fat. Foods that have less than 5% of daily value (DV) of fat and 0 g of trans fats may help with your symptoms. Cooking Cook foods using methods other than frying. This may include baking, steaming, grilling, or broiling. These are all methods that do not need a lot of fat for cooking. To add flavor, try to use herbs that are low in spice and acidity. Meal planning  Choose healthy foods that are low in fat, such as fruits, vegetables, whole grains, low-fat dairy products, lean meats, fish, and poultry. Eat frequent, small meals instead of three large meals each day. Eat your meals slowly, in a relaxed setting. Avoid bending over or lying down until 2-3 hours after eating. Limit high-fat foods such as fatty meats or fried foods. Limit your intake of fatty foods, such as oils, butter, and shortening. Avoid the following as told by your health care provider: Foods that cause symptoms. These may be different for different people. Keep a  food diary to keep track of foods that cause symptoms. Alcohol. Drinking large amounts of liquid with meals. Eating meals during the 2-3 hours before bed. Lifestyle Maintain a healthy weight. Ask your health care provider what weight is healthy for you. If you need to lose weight, work with your health care provider to do so safely. Exercise for at least 30 minutes on 5 or more days each week, or as told by your health care provider. Avoid wearing clothes that fit tightly around your waist and chest. Do not use any products that contain  nicotine or tobacco. These products include cigarettes, chewing tobacco, and vaping devices, such as e-cigarettes. If you need help quitting, ask your health care provider. Sleep with the head of your bed raised. Use a wedge under the mattress or blocks under the bed frame to raise the head of the bed. Chew sugar-free gum after mealtimes. What foods should I eat?  Eat a healthy, well-balanced diet of fruits, vegetables, whole grains, low-fat dairy products, lean meats, fish, and poultry. Each person is different. Foods that may trigger symptoms in one person may not trigger any symptoms in another person. Work with your health care provider to identify foods that are safe for you. The items listed above may not be a complete list of recommended foods and beverages. Contact a dietitian for more information. What foods should I avoid? Limiting some of these foods may help manage the symptoms of GERD. Everyone is different. Consult a dietitian or your health care provider to help you identify the exact foods to avoid, if any. Fruits Any fruits prepared with added fat. Any fruits that cause symptoms. For some people this may include citrus fruits, such as oranges, grapefruit, pineapple, and lemons. Vegetables Deep-fried vegetables. [place] fries. Any vegetables prepared with added fat. Any vegetables that cause symptoms. For some people, this may include tomatoes and tomato products, chili peppers, onions and garlic, and horseradish. Grains Pastries or quick breads with added fat. Meats and other proteins High-fat meats, such as fatty beef or pork, hot dogs, ribs, ham, sausage, salami, and bacon. Fried meat or protein, including fried fish and fried chicken. Nuts and nut butters, in large amounts. Dairy Whole milk and chocolate milk. Sour cream. Cream. Ice cream. Cream cheese. Milkshakes. Fats and oils Butter. Margarine. Shortening. Ghee. Beverages Coffee and tea, with or without caffeine.  Carbonated beverages. Sodas. Energy drinks. Fruit juice made with acidic fruits, such as orange or grapefruit. Tomato juice. Alcoholic drinks. Sweets and desserts Chocolate and cocoa. Donuts. Seasonings and condiments Pepper. Peppermint and spearmint. Added salt. Any condiments, herbs, or seasonings that cause symptoms. For some people, this may include curry, hot sauce, or vinegar-based salad dressings. The items listed above may not be a complete list of foods and beverages to avoid. Contact a dietitian for more information. Questions to ask your health care provider Diet and lifestyle changes are usually the first steps that are taken to manage symptoms of GERD. If diet and lifestyle changes do not improve your symptoms, talk with your health care provider about taking medicines. Where to find more information [employer]: aboutgerd.org Summary When you have gastroesophageal reflux disease (GERD), food and lifestyle choices may be very helpful in easing the discomfort of GERD. Eat frequent, small meals instead of three large meals each day. Eat your meals slowly, in a relaxed setting. Avoid bending over or lying down until 2-3 hours after eating. Limit high-fat foods such as fatty meats  or fried foods. This information is not intended to replace advice given to you by your health care provider. Make sure you discuss any questions you have with your health care provider. Document Revised: 07/02/2019 Document Reviewed: 07/02/2019 Elsevier Patient Education  2024 [employer].

## 2022-11-30 NOTE — Progress Notes (Signed)
Full PFT Performed Today  

## 2023-02-02 ENCOUNTER — Encounter: Payer: Self-pay | Admitting: Primary Care

## 2023-02-02 DIAGNOSIS — J45909 Unspecified asthma, uncomplicated: Secondary | ICD-10-CM | POA: Insufficient documentation

## 2023-02-02 HISTORY — DX: Unspecified asthma, uncomplicated: J45.909

## 2023-03-07 ENCOUNTER — Encounter (HOSPITAL_BASED_OUTPATIENT_CLINIC_OR_DEPARTMENT_OTHER): Payer: BC Managed Care – PPO | Admitting: Internal Medicine

## 2023-03-30 DIAGNOSIS — M25551 Pain in right hip: Secondary | ICD-10-CM | POA: Diagnosis not present

## 2023-03-30 DIAGNOSIS — M79672 Pain in left foot: Secondary | ICD-10-CM | POA: Diagnosis not present

## 2023-04-15 DIAGNOSIS — E785 Hyperlipidemia, unspecified: Secondary | ICD-10-CM | POA: Diagnosis not present

## 2023-04-16 LAB — NMR, LIPOPROFILE
Cholesterol, Total: 221 mg/dL — ABNORMAL HIGH (ref 100–199)
HDL Particle Number: 27.2 umol/L — ABNORMAL LOW (ref >=30.5)
HDL-C: 52 mg/dL (ref >39)
LDL Particle Number: 1661 nmol/L — ABNORMAL HIGH (ref <1000)
LDL Size: 21.2 nm (ref >20.5)
LDL-C (NIH Calc): 158 mg/dL — ABNORMAL HIGH (ref 0–99)
LP-IR Score: 32 (ref <=45)
Small LDL Particle Number: 469 nmol/L (ref <=527)
Triglycerides: 61 mg/dL (ref 0–149)

## 2023-04-16 LAB — HIGH SENSITIVITY CRP: CRP, High Sensitivity: 1.4 mg/L (ref 0.00–3.00)

## 2023-04-18 ENCOUNTER — Encounter: Payer: Self-pay | Admitting: Cardiovascular Disease

## 2023-04-19 NOTE — Progress Notes (Unsigned)
 Cardiology Office Note:  .   Date:  04/21/2023  ID:  Steven Hendricks, DOB 10-25-1965, MRN [id number] PCP: Elias Else, MD (Inactive)  [facility] Providers Cardiologist:  None    Patient Profile: .      PMH Dyslipidemia Coronary artery disease CT Calcium score 11/2021 CAC Score 0.72 (37th percentile) Statin myalgia Negative LP(a) PVCs Aortic atherosclerosis Exertional dyspnea Former cigar smoker  Seen 07/2020 by Dr. 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  History of PVCs and underwent cardiac MR to rule out RV dysplasia.  No abnormalities were noted.  Chest CT revealed aortic atherosclerosis.  Coronary calcium score of 0.72, no evidence of coronary artery disease.  Seen in follow-up 12/08/2021 by Robin Searing, NP.  He was having increased shortness of breath over the previous 2 months.  He reported poor sleep hygiene and was advised to undergo home sleep study.  Echo 12/29/2021 revealed normal LVEF 60 to 65%, normal diastolic parameters, normal RV, no significant valve disease.  Home sleep study revealed no evidence of OSA.  Referred to advanced lipid disorder clinic and seen by Dr. Rennis Golden via telemetry medicine on 10/07/2021.  He reported long history of hyperlipidemia but stopped statin because he had general concerns about the medication.  He wanted to manage cholesterol with diet and exercise.  Lipid panel May 2023 revealed total cholesterol 228, LDL particle number 2215, LDL-C 164, HDL-C 37, triglycerides 146, and small LDL-P 1359.  He reported diet low in saturated fat and is generally active.  He was a former cigar smoker.  He was encouraged to continue diet and exercise, however to get 50% reduction necessary to keep risk low, he was agreeable to retrial statin therapy.  He tried rosuvastatin 20 mg daily but reported at return office visit 09/16/2022 that he could not tolerate it.  LP(a) was negative.  He  was not interested in pursuing other statin therapies.  Recommendations to consider ezetimibe or Nexletol which he wanted to investigate further.  He was advised to return in 6 months with lipid testing prior.       History of Present Illness: .   Steven Hendricks is a pleasant 58 y.o. male who is here today for follow-up of hyperlipidemia.  He reports he is feeling well and reports recent exercise is more consistent with yoga, stretching and deep breathing. He does occasional elliptical workouts which he reports are high impact.  Diet is overall very healthy as his wife has celiac disease and his son has type 1 DM. Has recently increased his consumption of ginger, cinnamon, walnuts, and olive oil. He denies chest pain, shortness of breath, edema, fatigue, palpitations, weakness, presyncope, syncope, orthopnea, and PND. We had a lengthy discussion about diet, exercise, ASCVD prevention and management of hyperlipidemia.   Discussed the use of AI scribe software for clinical note transcription with the patient, who gave verbal consent to proceed.   ROS: See HPI       Studies Reviewed: Marland Kitchen   EKG Interpretation Date/Time:  Thursday April 21 2023 09:39:52 EDT Ventricular Rate:  74 PR Interval:  156 QRS Duration:  94 QT Interval:  382 QTC Calculation: 424 R Axis:   -39  Text Interpretation: Normal sinus rhythm Left axis deviation No acute changes Confirmed by Eligha Bridegroom [phone]) on 04/21/2023 9:52:33 AM     Lipoprotein (a)  Date/Time Value Ref Range Status  09/09/2022 02:29 PM 17.7 <75.0 nmol/L Final  Comment:    Note:  Values greater than or equal to 75.0 nmol/L may        indicate an independent risk factor for CHD,        but must be evaluated with caution when applied        to non-Caucasian populations due to the        influence of genetic factors on Lp(a) across        ethnicities.      Risk Assessment/Calculations:      Physical Exam:   VS:  BP (!) 140/78 (BP  Location: Left Arm, Patient Position: Sitting, Cuff Size: Normal)   Pulse 74   Ht 6' (1.829 m)   Wt 183 lb (83 kg)   SpO2 97%   BMI 24.82 kg/m    Wt Readings from Last 3 Encounters:  04/21/23 183 lb (83 kg)  11/30/22 186 lb 9.6 oz (84.6 kg)  11/30/22 186 lb 12.8 oz (84.7 kg)    GEN: Well nourished, well developed in no acute distress NECK: No JVD; No carotid bruits CARDIAC: RRR, no murmurs, rubs, gallops RESPIRATORY:  Clear to auscultation without rales, wheezing or rhonchi  ABDOMEN: Soft, non-tender, non-distended EXTREMITIES:  No edema; No deformity     ASSESSMENT AND PLAN: .    Hyperlipidemia LDL goal < 70: Lipid panel completed 04/15/2023 with LDL particle number 1661, LDL-C 158, HDL-C 52, triglycerides 61, total cholesterol 221, and small LDL-P 469. High sensitivity CRP is normal. Small particle number and total particle numbers have improved since last NMR on 09/09/22. Lengthy discussion about potential treatments. Discussion about all available novel lipid lowering therapies. He will continue to consider treatment options and let us  know if he would like to start a medication. Will get repeat NMR in 6 months for surveillance.  Continue heart healthy diet along with aiming for at least 150 minutes of moderate intensity exercise along with weight lifting and resistance training each week.  Statin intolerance: He reports previous intolerance of rosuvastatin and atorvastatin.  He did not give specific symptoms but reported general concern about all medications. Could consider rosuvastatin 5 mg 3 days per week if LDL remains above goal.   CAD: CT calcium score 0.72 in LAD (37th percentile). I reviewed this result with him in detail. He denies chest pain, dyspnea, or other symptoms concerning for angina. No indication for further ischemic evaluation at this time. We discussed potential symptoms to report in the future. Focus on secondary prevention including heart healthy mostly plant based  diet avoiding saturated fat, processed foods, simple carbohydrates, and sugar along with aiming for at least 150 minutes of moderate intensity exercise each week.         Disposition:6 months with Dr. Maximo Spar or me  Signed, Slater Duncan, NP-C

## 2023-04-20 NOTE — Telephone Encounter (Signed)
 Jenna, I will let Dr. Maximo Spar field that when he returns.

## 2023-04-21 ENCOUNTER — Encounter (HOSPITAL_BASED_OUTPATIENT_CLINIC_OR_DEPARTMENT_OTHER): Payer: Self-pay | Admitting: Nurse Practitioner

## 2023-04-21 ENCOUNTER — Ambulatory Visit (INDEPENDENT_AMBULATORY_CARE_PROVIDER_SITE_OTHER): Payer: BC Managed Care – PPO | Admitting: Nurse Practitioner

## 2023-04-21 VITALS — BP 140/78 | HR 74 | Ht 72.0 in | Wt 183.0 lb

## 2023-04-21 DIAGNOSIS — I2510 Atherosclerotic heart disease of native coronary artery without angina pectoris: Secondary | ICD-10-CM

## 2023-04-21 DIAGNOSIS — E785 Hyperlipidemia, unspecified: Secondary | ICD-10-CM

## 2023-04-21 DIAGNOSIS — T466X5D Adverse effect of antihyperlipidemic and antiarteriosclerotic drugs, subsequent encounter: Secondary | ICD-10-CM

## 2023-04-21 DIAGNOSIS — M791 Myalgia, unspecified site: Secondary | ICD-10-CM | POA: Diagnosis not present

## 2023-04-21 NOTE — Patient Instructions (Addendum)
 Medication Instructions:   Your physician recommends that you continue on your current medications as directed. Please refer to the Current Medication list given to you today.   *If you need a refill on your cardiac medications before your next appointment, please call your pharmacy*  Lab Work:  Your physician recommends that you return for a FASTING NMR, fasting after midnight, prior to your appointment with Dr. Maximo Spar or Manuella Seller. Paperwork given to pt today.   If you have labs (blood work) drawn today and your tests are completely normal, you will receive your results only by: MyChart Message (if you have MyChart) OR A paper copy in the mail If you have any lab test that is abnormal or we need to change your treatment, we will call you to review the results.  Testing/Procedures:  None ordered.  Follow-Up: At [facility], you and your health needs are our priority.  As part of our continuing mission to provide you with exceptional heart care, our providers are all part of one team.  This team includes your primary Cardiologist (physician) and Advanced Practice Providers or APPs (Physician Assistants and Nurse Practitioners) who all work together to provide you with the care you need, when you need it.  Your next appointment:   6 month(s)  Provider:   K. Italy Hilty, MD or Slater Duncan, NP    We recommend signing up for the patient portal called "MyChart".  Sign up information is provided on this After Visit Summary.  MyChart is used to connect with patients for Virtual Visits (Telemedicine).  Patients are able to view lab/test results, encounter notes, upcoming appointments, etc.  Non-urgent messages can be sent to your provider as well.   To learn more about what you can do with MyChart, go to [email or web address].   Other Instructions  Your physician wants you to follow-up in: 6 months.  You will receive a reminder letter in the mail two months in  advance. If you don't receive a letter, please call our office to schedule the follow-up appointment.

## 2023-05-11 DIAGNOSIS — M25551 Pain in right hip: Secondary | ICD-10-CM | POA: Diagnosis not present

## 2023-05-24 ENCOUNTER — Ambulatory Visit (INDEPENDENT_AMBULATORY_CARE_PROVIDER_SITE_OTHER): Admitting: Pulmonary Disease

## 2023-05-24 ENCOUNTER — Encounter: Payer: Self-pay | Admitting: Pulmonary Disease

## 2023-05-24 VITALS — BP 122/74 | HR 64 | Ht 72.0 in | Wt 185.0 lb

## 2023-05-24 DIAGNOSIS — J3089 Other allergic rhinitis: Secondary | ICD-10-CM | POA: Diagnosis not present

## 2023-05-24 DIAGNOSIS — F17290 Nicotine dependence, other tobacco product, uncomplicated: Secondary | ICD-10-CM | POA: Diagnosis not present

## 2023-05-24 DIAGNOSIS — J4520 Mild intermittent asthma, uncomplicated: Secondary | ICD-10-CM

## 2023-05-24 DIAGNOSIS — R0602 Shortness of breath: Secondary | ICD-10-CM | POA: Diagnosis not present

## 2023-05-24 MED ORDER — AIRSUPRA 90-80 MCG/ACT IN AERO: 2.0000 | INHALATION_SPRAY | RESPIRATORY_TRACT | 2 refills | Status: AC | PRN

## 2023-05-24 MED ORDER — ALBUTEROL SULFATE HFA 108 (90 BASE) MCG/ACT IN AERS: 2.0000 | INHALATION_SPRAY | RESPIRATORY_TRACT | 3 refills | Status: DC | PRN

## 2023-05-24 NOTE — Patient Instructions (Signed)
 Will see you back in 6 months  Can repeat a breathing study at that time  Prescription for Ventolin will be sent to pharmacy-use as needed - Can be used about 15-30 minutes before activities  Prescription for Airsupra  can be used as needed-this is a steroid and albuterol combination which can be used as needed  Call us , send us  a message with any significant concerns

## 2023-05-24 NOTE — Progress Notes (Signed)
 Steven Hendricks    [id number]    23-Jul-1965  Primary Care Physician:Reade, Porfirio Bristol, MD  Referring Physician: Vidal Graven, MD [phone] [address] [place],  [place] [address]  Chief complaint:   Shortness of breath  HPI:  Shortness of breath with activity  Occasional wheezing on coughing episodes  Remembers being more pronounced following having a COVID infection but less so now  Trying to get back into getting more active  Occasional cigar smoking  Used to run actively  PFT previously showed bronchodilator response of about 8%, hyperinflated lungs - There may be a component of air trapping   Outpatient Encounter Medications as of 05/24/2023  Medication Sig   albuterol (VENTOLIN HFA) 108 (90 Base) MCG/ACT inhaler Inhale 2 puffs into the lungs every 4 (four) hours as needed.   Albuterol-Budesonide  (AIRSUPRA ) 90-80 MCG/ACT AERO Inhale 2 puffs into the lungs every 4 (four) hours as needed (Shortness of breath/wheezing).   Ascorbic Acid (VITAMIN C) 100 MG tablet Take 100 mg by mouth daily.   cholecalciferol (VITAMIN D3) 25 MCG (1000 UNIT) tablet Take 1,000 Units by mouth daily.   EPINEPHrine (EPIPEN 2-PAK) 0.3 mg/0.3 mL IJ SOAJ injection    magnesium 30 MG tablet Take 30 mg by mouth daily.   Misc Natural Products (OSTEO BI-FLEX ADV JOINT SHIELD) TABS Take 2 tablets by mouth daily.   Multiple Vitamin (MULTI-VITAMIN) tablet Take 1 tablet by mouth daily.   Omega-3 Fatty Acids (FISH OIL) 300 MG CAPS Take 1 capsule by mouth daily.   Turmeric (QC TUMERIC COMPLEX PO) Take 1 tablet by mouth daily.   No facility-administered encounter medications on file as of 05/24/2023.    Allergies as of 05/24/2023   (No Known Allergies)    Past Medical History:  Diagnosis Date   Asthma    Asthma 02/02/2023   Diverticulosis    Hemorrhoids    History of hernia repair    right   IBS (irritable bowel syndrome)    Ulnar nerve compression     Past Surgical History:   Procedure Laterality Date   COLONOSCOPY     HERNIA REPAIR      Family History  Problem Relation Age of Onset   Irritable bowel syndrome Sister    Crohn's disease Sister     Social History   Socioeconomic History   Marital status: Married    Spouse name: Not on file   Number of children: Not on file   Years of education: Not on file   Highest education level: Not on file  Occupational History   Not on file  Tobacco Use   Smoking status: Some Days    Types: Cigars   Smokeless tobacco: Never   Tobacco comments:    04/21/2023 Patient smokes cigars 2-3 times a year "give or take"    2 or 3 a year  Vaping Use   Vaping status: Never Used  Substance and Sexual Activity   Alcohol use: Yes    Comment: weekly   Drug use: No   Sexual activity: Not on file  Other Topics Concern   Not on file  Social History Narrative   Not on file   Social Drivers of Health   Financial Resource Strain: Not on file  Food Insecurity: Not on file  Transportation Needs: Not on file  Physical Activity: Not on file  Stress: Not on file  Social Connections: Not on file  Intimate Partner Violence: Not  on file    Review of Systems  Respiratory:  Positive for shortness of breath.     Vitals:   05/24/23 1113  BP: 122/74  Pulse: 64  SpO2: 100%     Physical Exam Constitutional:      Appearance: Normal appearance.  HENT:     Head: Normocephalic.     Mouth/Throat:     Mouth: Mucous membranes are moist.  Eyes:     General: No scleral icterus. Cardiovascular:     Rate and Rhythm: Normal rate and regular rhythm.     Heart sounds: No murmur heard.    No friction rub.  Pulmonary:     Effort: No respiratory distress.     Breath sounds: No stridor. No wheezing or rhonchi.  Musculoskeletal:     Cervical back: No rigidity or tenderness.  Neurological:     Mental Status: He is alert.  Psychiatric:        Mood and Affect: Mood normal.    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

## 2023-06-23 ENCOUNTER — Encounter (HOSPITAL_BASED_OUTPATIENT_CLINIC_OR_DEPARTMENT_OTHER): Payer: BC Managed Care – PPO | Admitting: Internal Medicine

## 2023-12-26 DIAGNOSIS — N3281 Overactive bladder: Secondary | ICD-10-CM | POA: Diagnosis not present

## 2023-12-26 DIAGNOSIS — N411 Chronic prostatitis: Secondary | ICD-10-CM | POA: Diagnosis not present

## 2024-01-27 ENCOUNTER — Ambulatory Visit (HOSPITAL_BASED_OUTPATIENT_CLINIC_OR_DEPARTMENT_OTHER)

## 2024-01-27 ENCOUNTER — Encounter: Payer: Self-pay | Admitting: Primary Care

## 2024-01-27 ENCOUNTER — Ambulatory Visit (INDEPENDENT_AMBULATORY_CARE_PROVIDER_SITE_OTHER): Admitting: Primary Care

## 2024-01-27 ENCOUNTER — Ambulatory Visit

## 2024-01-27 ENCOUNTER — Other Ambulatory Visit (HOSPITAL_BASED_OUTPATIENT_CLINIC_OR_DEPARTMENT_OTHER): Payer: Self-pay

## 2024-01-27 VITALS — BP 120/72 | HR 69 | Temp 97.3°F | Ht 73.0 in | Wt 188.8 lb

## 2024-01-27 DIAGNOSIS — F17210 Nicotine dependence, cigarettes, uncomplicated: Secondary | ICD-10-CM

## 2024-01-27 DIAGNOSIS — J45909 Unspecified asthma, uncomplicated: Secondary | ICD-10-CM

## 2024-01-27 DIAGNOSIS — R0602 Shortness of breath: Secondary | ICD-10-CM

## 2024-01-27 DIAGNOSIS — J4520 Mild intermittent asthma, uncomplicated: Secondary | ICD-10-CM

## 2024-01-27 MED ORDER — FLUTICASONE-SALMETEROL 250-50 MCG/ACT IN AEPB: 1.0000 | INHALATION_SPRAY | Freq: Two times a day (BID) | RESPIRATORY_TRACT | 2 refills | Status: AC

## 2024-01-27 NOTE — Patient Instructions (Addendum)
" °  VISIT SUMMARY: Steven Hendricks, a 59 year old male with asthma and polymyalgia rheumatica, visited due to increased use of rescue inhalers and back pain. He has been experiencing more frequent shortness of breath and wheezing, leading to increased inhaler use. He also reports significant back pain and a flare-up of polymyalgia rheumatica, causing widespread joint pain. He has a history of COVID-19, which he believes may have contributed to his respiratory symptoms. A chest x-ray in 2024 showed hyperinflation, and he has not had recent imaging of his spine.  YOUR PLAN: -ASTHMA: Asthma is a condition where your airways narrow and swell, producing extra mucus, which can make breathing difficult. You have been experiencing increased shortness of breath and wheezing, leading to more frequent use of your rescue inhaler. We have prescribed a maintenance inhaler (Breo or Advair) for daily use to help control your symptoms. We have also rescheduled your pulmonary function test and ordered a chest x-ray to update your imaging. If your symptoms persist or if we find any abnormalities, we may consider a CT scan. Please remember to rinse your mouth after using the inhaler to prevent oral thrush.  -BACK PAIN: Your back pain, which is persistent and not relieved by massage, is of great concern. We have ordered a chest x-ray to help determine the cause of your pain and to update your imaging. Depending on the results, we may consider further imaging or other treatments.  -POLYMYALGIA RHEUMATICA (PMR): Polymyalgia rheumatica is an inflammatory disorder that causes muscle pain and stiffness, especially in the shoulders. Your PMR is currently flaring up, causing widespread joint pain. You are currently on a low dose of prednisone (5 mg) for PMR, prescribed by your primary care physician. We will continue to monitor your condition and adjust treatment as necessary.  INSTRUCTIONS: Please follow up with your primary care  physician and rheumatologist as scheduled. Use the prescribed maintenance inhaler daily and rinse your mouth after each use. Attend the rescheduled pulmonary function test and chest x-ray appointments. If your symptoms persist or worsen, please contact our office.  Orders: CXR   RX: Breo is no covered Sending in Advair 250-50mcg- take 1 puffs every 12 hours (morning and evening- rinse mouth after use)   Follow-up After PFTs with Dr. Neda Mow text generated by [employer].   "

## 2024-01-27 NOTE — Progress Notes (Signed)
 "  @Patient  ID: Garnette ONEIDA Abbott, male    DOB: Sep 18, 1965, 59 y.o.   MRN: [id number]  Chief Complaint  Patient presents with   Asthma   Follow-up    Referring provider: Neda Jennet LABOR, MD  HPI: 59 year old male, current some day smoker. PMH significant for asthma, laryngopharyngeal reflux, PMR. Patient of Dr. Neda.   01/27/2024 Discussed the use of AI scribe software for clinical note transcription with the patient, who gave verbal consent to proceed.  History of Present Illness DEVEION DENZ is a 59 year old male with asthma and polymyalgia rheumatica who presents with increased use of rescue inhalers and back pain.  He has experienced increased use of rescue inhalers over the past few months, more than he has used in his entire life. Shortness of breath and wheezing are the primary symptoms prompting this increased use. He has been using various short-acting bronchodilators, including Air Supra, which contains a steroid, for intermittent symptoms. Previously, he was on Breo, a daily maintenance inhaler, but has not been using it recently.  He reports upper back pain, described as being left of the spine and not joint-related. The pain is persistent and not relieved by massage. Initially, a 30-day course of treatment temporarily alleviated the symptoms, but the pain has returned and is of great concern to him.  He has a history of polymyalgia rheumatica (PMR), which is currently flaring up, causing widespread joint pain, particularly in the shoulders and hands. He is currently on a low dose of prednisone (5 mg) for PMR, prescribed by his primary care physician, as he cannot see his rheumatologist until April. He questions whether the inflammation from PMR could be contributing to his respiratory symptoms. Recent high sensitivity CRP was normal in April 2025.   He has had COVID-19 a couple of times in the past, and he notes that his respiratory symptoms, including wheezing, began  after these infections. He has not had recent imaging of his spine, but a cervical spine imaging was last done in 2019. A chest x-ray in 2024 showed hyperinflation.  In the review of symptoms, he reports chest tightness and a cough that follows wheezing, which he describes as random rather than exertional. No asthma symptoms wake him up at night, but his PMR-related joint pain does disturb his sleep.  ACT score 20   Allergies[1]  Immunization History  Administered Date(s) Administered   Janssen (J&J) SARS-COV-2 Vaccination 06/12/2019, 09/01/2020    Past Medical History:  Diagnosis Date   Asthma    Asthma 02/02/2023   Diverticulosis    Hemorrhoids    History of hernia repair    right   IBS (irritable bowel syndrome)    Ulnar nerve compression     Tobacco History: Tobacco Use History[2] Ready to quit: Not Answered Counseling given: Not Answered Tobacco comments: 04/21/2023 Patient smokes cigars 2-3 times a year give or take 2 or 3 a year   Outpatient Medications Prior to Visit  Medication Sig Dispense Refill   albuterol  (VENTOLIN  HFA) 108 (90 Base) MCG/ACT inhaler Inhale 2 puffs into the lungs every 4 (four) hours as needed. 8 g 3   Albuterol -Budesonide  (AIRSUPRA ) 90-80 MCG/ACT AERO Inhale 2 puffs into the lungs every 4 (four) hours as needed (Shortness of breath/wheezing). 10.7 g 2   Ascorbic Acid (VITAMIN C) 100 MG tablet Take 100 mg by mouth daily.     cholecalciferol (VITAMIN D3) 25 MCG (1000 UNIT) tablet Take 1,000 Units by mouth daily.  EPINEPHrine (EPIPEN 2-PAK) 0.3 mg/0.3 mL IJ SOAJ injection      magnesium 30 MG tablet Take 30 mg by mouth daily.     Misc Natural Products (OSTEO BI-FLEX ADV JOINT SHIELD) TABS Take 2 tablets by mouth daily.     Multiple Vitamin (MULTI-VITAMIN) tablet Take 1 tablet by mouth daily.     Omega-3 Fatty Acids (FISH OIL) 300 MG CAPS Take 1 capsule by mouth daily.     Turmeric (QC TUMERIC COMPLEX PO) Take 1 tablet by mouth daily.      No facility-administered medications prior to visit.   Review of Systems  Review of Systems  Constitutional: Negative.   Respiratory:  Positive for shortness of breath and wheezing.   Musculoskeletal:  Positive for arthralgias.     Physical Exam  BP 120/72 (BP Location: Left Arm, Patient Position: Sitting, Cuff Size: Large)   Pulse 69   Temp (!) 97.3 F (36.3 C)   Ht 6' 1 (1.854 m)   Wt 188 lb 12.8 oz (85.6 kg)   SpO2 99%   BMI 24.91 kg/m  Physical Exam Constitutional:      Appearance: Normal appearance. He is well-developed.  HENT:     Head: Normocephalic and atraumatic.     Mouth/Throat:     Mouth: Mucous membranes are moist.     Pharynx: Oropharynx is clear.  Cardiovascular:     Rate and Rhythm: Normal rate and regular rhythm.     Heart sounds: Normal heart sounds.  Pulmonary:     Effort: Pulmonary effort is normal. No respiratory distress.     Breath sounds: Normal breath sounds. No wheezing or rhonchi.     Comments: CTA Musculoskeletal:        General: Normal range of motion.     Cervical back: Normal range of motion and neck supple.  Skin:    General: Skin is warm and dry.     Findings: No erythema or rash.  Neurological:     General: No focal deficit present.     Mental Status: He is alert and oriented to person, place, and time. Mental status is at baseline.  Psychiatric:        Mood and Affect: Mood normal.        Behavior: Behavior normal.        Thought Content: Thought content normal.        Judgment: Judgment normal.    Lab Results:  CBC    Component Value Date/Time   WBC 7.6 07/27/2022 1609   WBC 7.0 02/22/2020 1530   RBC 4.66 07/27/2022 1609   RBC 5.37 02/22/2020 1530   HGB 14.8 07/27/2022 1609   HCT 42.9 07/27/2022 1609   PLT 175 07/27/2022 1609   MCV 92 07/27/2022 1609   MCH 31.8 07/27/2022 1609   MCH 31.3 02/22/2020 1530   MCHC 34.5 07/27/2022 1609   MCHC 34.9 02/22/2020 1530   RDW 13.2 07/27/2022 1609   LYMPHSABS 2.4  07/27/2022 1609   MONOABS 1.6 (H) 02/22/2020 1530   EOSABS 0.1 07/27/2022 1609   BASOSABS 0.1 07/27/2022 1609    BMET    Component Value Date/Time   NA 143 07/27/2022 1609   K 4.3 07/27/2022 1609   CL 104 07/27/2022 1609   CO2 26 07/27/2022 1609   GLUCOSE 82 07/27/2022 1609   GLUCOSE 116 (H) 02/22/2020 1530   BUN 17 07/27/2022 1609   CREATININE 1.11 07/27/2022 1609   CALCIUM  9.4 07/27/2022 1609   GFRNONAA >60  02/22/2020 1530    BNP No results found for: BNP  ProBNP No results found for: PROBNP  Imaging: No results found.   Assessment & Plan:   1. Asthma, unspecified asthma severity, unspecified whether complicated, unspecified whether persistent (Primary)  Assessment and Plan Assessment & Plan Asthma Intermittent symptoms of shortness of breath and wheezing, increased use of rescue inhaler over the last few months. No obstructive defect on pulmonary function testing. Possible exacerbation related to polymyalgia rheumatica flare-up. Previous chest x-ray showed hyperinflation, possibly due to air trapping or anatomical factors. No current wheezing or crackles on examination. Increased inhaler use may be related to systemic inflammation from PMR. - Prescribed maintenance ICS/LABA, Advair 250-50mcg one puff q12 hours  - Rescheduled pulmonary function test. - Ordered chest x-ray due to pleuritic back pain  - Will consider CT scan if symptoms persist or if abnormalities are found in pulmonary function test or chest x-ray. - Advised rinsing mouth after using inhaler to prevent oral thrush.   Almarie LELON Ferrari, NP 01/27/2024     [1] No Known Allergies [2]  Social History Tobacco Use  Smoking Status Some Days   Types: Cigars  Smokeless Tobacco Never  Tobacco Comments   04/21/2023 Patient smokes cigars 2-3 times a year give or take   2 or 3 a year   "

## 2024-02-01 ENCOUNTER — Ambulatory Visit

## 2024-02-01 ENCOUNTER — Encounter (HOSPITAL_BASED_OUTPATIENT_CLINIC_OR_DEPARTMENT_OTHER)

## 2024-02-01 ENCOUNTER — Telehealth (HOSPITAL_BASED_OUTPATIENT_CLINIC_OR_DEPARTMENT_OTHER): Payer: Self-pay | Admitting: Pulmonary Disease

## 2024-02-01 ENCOUNTER — Encounter (HOSPITAL_BASED_OUTPATIENT_CLINIC_OR_DEPARTMENT_OTHER): Payer: Self-pay

## 2024-02-01 ENCOUNTER — Other Ambulatory Visit: Payer: Self-pay

## 2024-02-01 DIAGNOSIS — R0602 Shortness of breath: Secondary | ICD-10-CM | POA: Diagnosis not present

## 2024-02-01 LAB — PULMONARY FUNCTION TEST
DL/VA % pred: 92 %
DL/VA: 3.91 ml/min/mmHg/L
DLCO unc % pred: 107 %
DLCO unc: 31.25 ml/min/mmHg
FEF 25-75 Post: 4.59 L/s
FEF 25-75 Pre: 3.93 L/s
FEF2575-%Change-Post: 16 %
FEF2575-%Pred-Post: 145 %
FEF2575-%Pred-Pre: 124 %
FEV1-%Change-Post: 4 %
FEV1-%Pred-Post: 121 %
FEV1-%Pred-Pre: 115 %
FEV1-Post: 4.66 L
FEV1-Pre: 4.44 L
FEV1FVC-%Change-Post: 3 %
FEV1FVC-%Pred-Pre: 102 %
FEV6-%Change-Post: 1 %
FEV6-%Pred-Post: 119 %
FEV6-%Pred-Pre: 117 %
FEV6-Post: 5.77 L
FEV6-Pre: 5.68 L
FEV6FVC-%Change-Post: 0 %
FEV6FVC-%Pred-Post: 104 %
FEV6FVC-%Pred-Pre: 104 %
FVC-%Change-Post: 1 %
FVC-%Pred-Post: 114 %
FVC-%Pred-Pre: 112 %
FVC-Post: 5.8 L
FVC-Pre: 5.71 L
Post FEV1/FVC ratio: 80 %
Post FEV6/FVC ratio: 100 %
Pre FEV1/FVC ratio: 78 %
Pre FEV6/FVC Ratio: 100 %
RV % pred: 137 %
RV: 3.17 L
TLC % pred: 124 %
TLC: 9.05 L

## 2024-02-01 NOTE — Patient Instructions (Signed)
 Full pft performed today

## 2024-02-01 NOTE — Progress Notes (Signed)
 Full pft performed today

## 2024-02-08 ENCOUNTER — Ambulatory Visit: Payer: Self-pay

## 2024-02-08 MED ORDER — ALBUTEROL SULFATE HFA 108 (90 BASE) MCG/ACT IN AERS: 2.0000 | INHALATION_SPRAY | RESPIRATORY_TRACT | 3 refills | Status: AC | PRN

## 2024-02-09 LAB — PULMONARY FUNCTION TEST
FEV1-%Pred-Post: 33 %
FEV1-Post: 1.28 L
FEV6-%Pred-Post: 46 %
FEV6-Post: 2.23 L
FEV6FVC-%Pred-Post: 104 %
FVC-%Pred-Post: 44 %
FVC-Post: 2.23 L
Post FEV1/FVC ratio: 57 %
Post FEV6/FVC ratio: 100 %

## 2024-04-12 ENCOUNTER — Ambulatory Visit: Admitting: Internal Medicine
